# Patient Record
Sex: Female | Born: 1955 | Race: Black or African American | Hispanic: No | Smoking: Never smoker
Health system: Southern US, Community
[De-identification: ages and names within clinical notes are randomized; demographics above are authoritative.]

## PROBLEM LIST (undated history)

## (undated) DIAGNOSIS — F329 Major depressive disorder, single episode, unspecified: Secondary | ICD-10-CM

## (undated) DIAGNOSIS — K219 Gastro-esophageal reflux disease without esophagitis: Secondary | ICD-10-CM

## (undated) DIAGNOSIS — R002 Palpitations: Secondary | ICD-10-CM

## (undated) DIAGNOSIS — J45909 Unspecified asthma, uncomplicated: Secondary | ICD-10-CM

## (undated) DIAGNOSIS — D329 Benign neoplasm of meninges, unspecified: Secondary | ICD-10-CM

## (undated) DIAGNOSIS — R55 Syncope and collapse: Secondary | ICD-10-CM

## (undated) DIAGNOSIS — N6320 Unspecified lump in the left breast, unspecified quadrant: Secondary | ICD-10-CM

## (undated) DIAGNOSIS — R911 Solitary pulmonary nodule: Secondary | ICD-10-CM

## (undated) DIAGNOSIS — I10 Essential (primary) hypertension: Secondary | ICD-10-CM

## (undated) DIAGNOSIS — G4733 Obstructive sleep apnea (adult) (pediatric): Secondary | ICD-10-CM

## (undated) DIAGNOSIS — F32A Depression, unspecified: Secondary | ICD-10-CM

## (undated) DIAGNOSIS — G43909 Migraine, unspecified, not intractable, without status migrainosus: Secondary | ICD-10-CM

## (undated) DIAGNOSIS — S2249XA Multiple fractures of ribs, unspecified side, initial encounter for closed fracture: Secondary | ICD-10-CM

## (undated) DIAGNOSIS — E785 Hyperlipidemia, unspecified: Secondary | ICD-10-CM

## (undated) DIAGNOSIS — G459 Transient cerebral ischemic attack, unspecified: Secondary | ICD-10-CM

## (undated) DIAGNOSIS — S2239XA Fracture of one rib, unspecified side, initial encounter for closed fracture: Secondary | ICD-10-CM

## (undated) DIAGNOSIS — Z9289 Personal history of other medical treatment: Secondary | ICD-10-CM

## (undated) DIAGNOSIS — H409 Unspecified glaucoma: Secondary | ICD-10-CM

## (undated) HISTORY — DX: Solitary pulmonary nodule: R91.1

## (undated) HISTORY — DX: Transient cerebral ischemic attack, unspecified: G45.9

## (undated) HISTORY — DX: Fracture of one rib, unspecified side, initial encounter for closed fracture: S22.39XA

## (undated) HISTORY — DX: Migraine, unspecified, not intractable, without status migrainosus: G43.909

## (undated) HISTORY — DX: Benign neoplasm of meninges, unspecified: D32.9

## (undated) HISTORY — DX: Depression, unspecified: F32.A

## (undated) HISTORY — DX: Multiple fractures of ribs, unspecified side, initial encounter for closed fracture: S22.49XA

## (undated) HISTORY — DX: Major depressive disorder, single episode, unspecified: F32.9

## (undated) HISTORY — DX: Syncope and collapse: R55

## (undated) HISTORY — DX: Palpitations: R00.2

## (undated) HISTORY — DX: Hyperlipidemia, unspecified: E78.5

## (undated) HISTORY — DX: Unspecified lump in the left breast, unspecified quadrant: N63.20

## (undated) HISTORY — DX: Personal history of other medical treatment: Z92.89

## (undated) HISTORY — DX: Obstructive sleep apnea (adult) (pediatric): G47.33

---

## 1988-11-25 HISTORY — PX: BREAST EXCISIONAL BIOPSY: SUR124

## 2005-07-10 ENCOUNTER — Ambulatory Visit: Payer: Self-pay | Admitting: Family Medicine

## 2005-11-25 HISTORY — PX: COLONOSCOPY: SHX174

## 2006-08-04 ENCOUNTER — Other Ambulatory Visit: Payer: Self-pay

## 2006-08-04 ENCOUNTER — Inpatient Hospital Stay: Payer: Self-pay | Admitting: Internal Medicine

## 2006-09-02 ENCOUNTER — Ambulatory Visit: Payer: Self-pay

## 2006-11-05 ENCOUNTER — Ambulatory Visit: Payer: Self-pay | Admitting: Gastroenterology

## 2006-12-09 ENCOUNTER — Emergency Department: Payer: Self-pay | Admitting: General Practice

## 2006-12-18 ENCOUNTER — Emergency Department: Payer: Self-pay | Admitting: Emergency Medicine

## 2007-02-13 ENCOUNTER — Other Ambulatory Visit: Payer: Self-pay

## 2007-02-13 ENCOUNTER — Inpatient Hospital Stay: Payer: Self-pay | Admitting: Internal Medicine

## 2008-02-29 ENCOUNTER — Ambulatory Visit: Payer: Self-pay

## 2009-03-23 ENCOUNTER — Ambulatory Visit: Payer: Self-pay | Admitting: Family Medicine

## 2009-10-26 ENCOUNTER — Emergency Department: Payer: Self-pay | Admitting: Unknown Physician Specialty

## 2010-06-21 ENCOUNTER — Ambulatory Visit: Payer: Self-pay | Admitting: Family Medicine

## 2012-08-03 ENCOUNTER — Ambulatory Visit: Payer: Self-pay | Admitting: Otolaryngology

## 2012-08-18 ENCOUNTER — Encounter: Payer: Self-pay | Admitting: Otolaryngology

## 2012-08-25 ENCOUNTER — Encounter: Payer: Self-pay | Admitting: Otolaryngology

## 2013-01-21 ENCOUNTER — Ambulatory Visit: Payer: Self-pay | Admitting: Family Medicine

## 2014-01-27 ENCOUNTER — Ambulatory Visit: Payer: Self-pay | Admitting: Nurse Practitioner

## 2014-04-17 ENCOUNTER — Emergency Department: Payer: Self-pay | Admitting: Emergency Medicine

## 2014-04-17 LAB — URINALYSIS, COMPLETE
Bacteria: NONE SEEN
Bilirubin,UR: NEGATIVE
Glucose,UR: NEGATIVE mg/dL (ref 0–75)
KETONE: NEGATIVE
Leukocyte Esterase: NEGATIVE
NITRITE: NEGATIVE
PH: 6 (ref 4.5–8.0)
Protein: NEGATIVE
RBC,UR: <1 /HPF (ref 0–5)
SQUAMOUS EPITHELIAL: NONE SEEN
Specific Gravity: 1.006 (ref 1.003–1.030)
WBC UR: 1 /HPF (ref 0–5)

## 2014-04-17 LAB — TROPONIN I: Troponin-I: <0.02 ng/mL

## 2014-04-17 LAB — COMPREHENSIVE METABOLIC PANEL
ALK PHOS: 74 U/L
ALT: 20 U/L (ref 12–78)
ANION GAP: 5 — AB (ref 7–16)
AST: 21 U/L (ref 15–37)
Albumin: 3.8 g/dL (ref 3.4–5.0)
BUN: 10 mg/dL (ref 7–18)
Bilirubin,Total: 0.3 mg/dL (ref 0.2–1.0)
CHLORIDE: 105 mmol/L (ref 98–107)
Calcium, Total: 9.4 mg/dL (ref 8.5–10.1)
Co2: 30 mmol/L (ref 21–32)
Creatinine: 0.68 mg/dL (ref 0.60–1.30)
EGFR (African American): >60
GLUCOSE: 104 mg/dL — AB (ref 65–99)
Osmolality: 279 (ref 275–301)
POTASSIUM: 3.8 mmol/L (ref 3.5–5.1)
SODIUM: 140 mmol/L (ref 136–145)
TOTAL PROTEIN: 7.8 g/dL (ref 6.4–8.2)

## 2014-04-17 LAB — CBC
HCT: 39.3 % (ref 35.0–47.0)
HGB: 12.8 g/dL (ref 12.0–16.0)
MCH: 27.4 pg (ref 26.0–34.0)
MCHC: 32.6 g/dL (ref 32.0–36.0)
MCV: 84 fL (ref 80–100)
Platelet: 328 10*3/uL (ref 150–440)
RBC: 4.67 10*6/uL (ref 3.80–5.20)
RDW: 13.7 % (ref 11.5–14.5)
WBC: 8.5 10*3/uL (ref 3.6–11.0)

## 2014-04-17 LAB — MAGNESIUM: Magnesium: 2 mg/dL

## 2014-04-17 LAB — PRO B NATRIURETIC PEPTIDE: B-TYPE NATIURETIC PEPTID: 39 pg/mL (ref 0–125)

## 2014-12-06 ENCOUNTER — Ambulatory Visit: Payer: Self-pay | Admitting: Obstetrics and Gynecology

## 2014-12-06 LAB — CBC WITH DIFFERENTIAL/PLATELET
BASOS PCT: 0.3 %
Basophil #: 0 10*3/uL (ref 0.0–0.1)
Eosinophil #: 0.1 10*3/uL (ref 0.0–0.7)
Eosinophil %: 1.1 %
HCT: 38.5 % (ref 35.0–47.0)
HGB: 12.1 g/dL (ref 12.0–16.0)
Lymphocyte #: 2.9 10*3/uL (ref 1.0–3.6)
Lymphocyte %: 35.7 %
MCH: 26.7 pg (ref 26.0–34.0)
MCHC: 31.4 g/dL — AB (ref 32.0–36.0)
MCV: 85 fL (ref 80–100)
MONO ABS: 0.5 x10 3/mm (ref 0.2–0.9)
Monocyte %: 6.7 %
NEUTROS PCT: 56.2 %
Neutrophil #: 4.5 10*3/uL (ref 1.4–6.5)
Platelet: 323 10*3/uL (ref 150–440)
RBC: 4.53 10*6/uL (ref 3.80–5.20)
RDW: 14.3 % (ref 11.5–14.5)
WBC: 8 10*3/uL (ref 3.6–11.0)

## 2014-12-06 LAB — PROTIME-INR
INR: 0.9
PROTHROMBIN TIME: 11.6 s (ref 11.5–14.7)

## 2014-12-19 ENCOUNTER — Ambulatory Visit: Payer: Self-pay | Admitting: Obstetrics and Gynecology

## 2014-12-19 HISTORY — PX: OTHER SURGICAL HISTORY: SHX169

## 2014-12-20 LAB — HEMOGLOBIN: HGB: 11.6 g/dL — AB (ref 12.0–16.0)

## 2015-03-20 LAB — SURGICAL PATHOLOGY

## 2015-03-26 NOTE — Consult Note (Signed)
PATIENT NAME:  Selena Walker, Selena Walker MR#:  [id number] DATE OF BIRTH:  Mar 18, 1956  DATE OF CONSULTATION:  12/19/2014  REFERRING PHYSICIAN:   CONSULTING PHYSICIAN:  Loreli Dollar, MD  HISTORY OF PRESENT ILLNESS: This 59 year old female was in the operating room having a robotically assisted total laparoscopic hysterectomy with bilateral salpingo-oophorectomy by Dr. Leafy Ro. Dr. Leafy Ro had completed the hysterectomy and noticed a small tear of what appeared to be the serosa or epiploic appendage in the region of the rectosigmoid junction and called for general surgery consultation.   On arrival I discussed this with Dr. Leafy Ro and viewed the laparoscopic video images, could see what appeared to be a small tear in the serosa. The sigmoid colon was identified coursing in the left pelvis towards the rectum. It appeared that this location of the tear was in the region of the rectosigmoid junction. There was no apparent fecal soilage. It appeared that hemostasis was intact. It was noted that electrocautery was used during the course of the procedure. There appeared to be no cautery artifact at this site.   Impression appeared to be a small serosal. Dr. Leafy Ro subsequently placed a 4-0 Vicryl figure-of-eight suture to repair this.   I later spoke to Miss Kostka in her room after surgery and she appears to be making satisfactory progress, has received 1 dose of Toradol and Dr. Leafy Ro found abdomen is with no significant tenderness.   The patient does have some past tendency towards constipation.   IMPRESSION:  1.  Serosal tear of the proximal rectum-distal sigmoid colon junction.  2.  History of constipation.   Suggested taking a stool softener.   Will sign off this case.    ____________________________ J. Rochel Brome, MD jws:bu D: 12/19/2014 17:56:51 ET T: 12/19/2014 19:35:11 ET JOB#: [id number]  cc: Loreli Dollar, MD, <Dictator> Loreli Dollar MD ELECTRONICALLY SIGNED 12/20/2014 18:35

## 2015-03-26 NOTE — Op Note (Signed)
PATIENT NAME:  Selena Walker, Selena MR#:  Walker DATE OF BIRTH:  1956-05-20  DATE OF PROCEDURE:  12/19/2014  PREOPERATIVE DIAGNOSES: Postmenopausal bleeding, endometrial fibroid, and endometrial polyp.  POSTOPERATIVE DIAGNOSES: Postmenopausal bleeding, endometrial fibroid, and endometrial polyp. Pelvic adhesions and aberrant vasculature.   PROCEDURE PERFORMED:  1.  Robotically assisted, total laparoscopic hysterectomy with bilateral salpingo-oophorectomy.  2.  Cystoscopy.  3.  Repair of colonic epiploica versus serosal defect.  4.  Lysis of adhesions. Of note, lysis of adhesions was a significant portion of the case.   SURGEON: Angelina Pih, MD   ASSISTANT: Will Bonnet, MD  INTRAOPERATIVE CONSULT: General surgery, Dr. Tamala Julian with [facility] ([facility])  IV FLUIDS: 1800 mL.   ESTIMATED BLOOD LOSS: 50 mL   URINE OUTPUT: 1300 mL, including cystoscopy fluid.   FINDINGS: Significant pelvic adhesions between the bladder and uterus, the uterus and pelvic sidewalls, and the bilateral adnexa such that the ovaries were tucked deep in the pelvis and the distal portions of the fallopian tubes were retroperitoneal.   COMPLICATIONS: A small 10 mm defect in the colonic peritoneum, into the epiploica that did not appear to ride into the serosa with no extrusion of any bowel contents or bleeding, at the level of the pelvic brim posteriorly.   SPECIMENS: Uterus and cervix, and right and left tube and ovaries.   CONDITION: Stable to PACU.  INDICATION FOR PROCEDURE: Ms. Selena Walker is a  59 year old postmenopausal female, G5, P4, with a history of postmenopausal bleeding. Preoperative diagnostic imaging and sampling revealed an endometrial polyp and multiple uterine fibroids, with a benign endometrial biopsy and an endometrium that measured 4 mm by ultrasound. She desires definitive management for intermittently significantly heavy bleeding, which remains persistently worrisome to the patient.    PROCEDURE: The patient was taken to the Operating Room, where she was identified by name and birth date. She was placed on the operating table in a supine position and general anesthesia was induced without difficulty. She was then positioned in a dorsal lithotomy position and was prepped and draped in the usual sterile fashion. Two grams of Ancef were given prior to the start of the procedure. A formal timeout procedure was performed with all team members present and in agreement. A vaginal speculum was placed, revealing a small cervix. The anterior lip of the cervix was grasped with a single-tooth tenaculum and a medium-size V-Care uterine manipulator was placed without incident. A Foley catheter was then placed for the remainder of the procedure. The patient was repositioned for robotic docking, and attention was turned to the abdomen.   A stab incision was made at Palmer's point in the left upper quadrant after verification of an orogastric tube was placed, and a Veress needle was used to insufflate the abdomen to 15 mmHg. The robotic ports were then placed in the a standard lazy-W position. The only port that was not placed under direct visualization was the first left lateral port; this was done to assure that no adhesions were at the site of the umbilical port as she had had previous surgery through her umbilicus. A total of 5 ports were placed, including a right bedside-assist port. The patient was placed in deep Trendelenberg, and the robot was side-docked in the usual position. A grasper was placed on arm #3. A bipolar Leda Gauze was placed in arm #2 and a monopolar scissors was placed in arm #1. Examination of the port entry sites revealed no lesions. There is no obvious pathology in the  abdomen.   Examination of the pelvis revealed filmy pelvic adhesions throughout. Her left fallopian tube appeared to be retroperitoneal, lateral to the bladder and curving above the round ligament. Her left ovary was  tucked in the posterior cul-de-sac, and her right tube and ovary were likewise obscured by adhesions. After identification of the ureter path, these right-sided adhesions were carefully taken down without complication for at least 30 minutes of the procedure. Attention was then turned to the left side. The IP ligament was identified, and followed to the ovary. The pathway of the left ureter was carefully followed where it crossed the pelvic brim and coursed along the left pelvic sidewall.The ovary was released through lysis of adhesions atraumatically. The IP ligament was then cauterized and the ovary was divided from the pelvic sidewall. Prior to any cautery taking place, the left ureter was followed over the pelvic brim and deep into the pelvis. The round ligament was then grasped and dissected carefully. The anterior and posterior broad ligaments were also dissected carefully down. Abberent vasculature was noted to be heavily congested on left uterine wall.   Attention was turned to the right side. In a similar fashion, further anterior lysis of adhesions restored normal anatomy. The round ligament was divided, and the anterior peritoneum was carefully excised. The left IP ligament was then cauterized and divided, being careful to avoid the right ureter which was kept in view at all times. The right fallopian tube and ovary were then carefully cauterized and dissected away from the pelvic sidewall, and the broad ligament was gently dissected down to identify the uterine arteries. Heavy vasculature was noted on the right side of the uterus as well. A bladder flap was carefully created, and the external cervical os was identified. Attention was returned to the left side, and the uterine vessels were dissected carefully down. Attention was turned to the right side and, in a similar fashion, the uterine vessels were cauterized and dissected away, being careful not to carry the cautery laterally.   The posterior  cul-de-sac was examined and the uterosacrals were noted to have heavy venous flow as well.  The monopolar cautery was used to open the vaginal cuff anteriorly, and this was carried to the right side laterally. The posterior cuff was similarly identified and, using a mixture of bipolar and monopolar cautery, the cuff was opened in a full circumferential way, taking care of bleeding as it occurred. Once the uterus was completely free of its attachments, it was pulled through into the vagina, maintaining pneumoperitoneum. The vaginal cuff was then closed with V-Loc suture in a running fashion for excellent hemostasis and reapproximation.   Because of the vascularity and electrocautery needed to assure hemostasis on the left side, a cystoscopy was undertaken at this time which revealed strong flow from the bilateral ureters. 1 mL or 100 mg of fluorescein was given IV to aid in the visualization of the urine flow. The Foley catheter was then placed into the bladder and the uterus was removed entirely from the vagina. Multiple small fibroids were noted on examination of the uterus.  Attention was then returned to the abdomen. A 10 mm defect was noted in the epiploica of the colon with a small pulsing vessel that had not been severed, noted in this pericolonic fat. General surgery intra-op consult was called, and a figure of eight suture was placed in the peritoneum above the epiploica to cover the vessel at their request. A figure-of-eight was used, 4-0 Vicryl suture  on an [place] needle, and was undertaken without complication. Full survey of the pelvis revealed no active bleeding, good reapproximation of the cuff, and the vascular pedicles were likewise dry.   General anesthesia was reversed without difficulty, and the patient is awaiting recovery in the PACU.    ____________________________ Angelina Pih, MD beb:MT D: 12/19/2014 13:10:30 ET T: 12/19/2014 15:04:43 ET JOB#: [id number]  cc: Angelina Pih,  MD, <Dictator> Angelina Pih MD ELECTRONICALLY SIGNED 12/20/2014 17:59

## 2015-08-29 ENCOUNTER — Emergency Department: Payer: Medicare HMO

## 2015-08-29 ENCOUNTER — Encounter: Payer: Self-pay | Admitting: *Deleted

## 2015-08-29 ENCOUNTER — Emergency Department
Admission: EM | Admit: 2015-08-29 | Discharge: 2015-08-29 | Disposition: A | Discharge status: Home / Self Care | Payer: Medicare HMO | Attending: Emergency Medicine | Admitting: Emergency Medicine

## 2015-08-29 DIAGNOSIS — I10 Essential (primary) hypertension: Secondary | ICD-10-CM | POA: Insufficient documentation

## 2015-08-29 DIAGNOSIS — R109 Unspecified abdominal pain: Secondary | ICD-10-CM

## 2015-08-29 DIAGNOSIS — R110 Nausea: Secondary | ICD-10-CM | POA: Diagnosis not present

## 2015-08-29 DIAGNOSIS — R1032 Left lower quadrant pain: Secondary | ICD-10-CM | POA: Insufficient documentation

## 2015-08-29 HISTORY — DX: Essential (primary) hypertension: I10

## 2015-08-29 HISTORY — DX: Unspecified glaucoma: H40.9

## 2015-08-29 LAB — COMPREHENSIVE METABOLIC PANEL
ALK PHOS: 58 U/L (ref 38–126)
ALT: 16 U/L (ref 14–54)
AST: 26 U/L (ref 15–41)
Albumin: 4.3 g/dL (ref 3.5–5.0)
Anion gap: 10 (ref 5–15)
BILIRUBIN TOTAL: 0.5 mg/dL (ref 0.3–1.2)
BUN: 15 mg/dL (ref 6–20)
CALCIUM: 9.7 mg/dL (ref 8.9–10.3)
CO2: 29 mmol/L (ref 22–32)
CREATININE: 0.8 mg/dL (ref 0.44–1.00)
Chloride: 100 mmol/L — ABNORMAL LOW (ref 101–111)
Glucose, Bld: 158 mg/dL — ABNORMAL HIGH (ref 65–99)
Potassium: 3 mmol/L — ABNORMAL LOW (ref 3.5–5.1)
Sodium: 139 mmol/L (ref 135–145)
Total Protein: 7.7 g/dL (ref 6.5–8.1)

## 2015-08-29 LAB — URINALYSIS COMPLETE WITH MICROSCOPIC (ARMC ONLY)
BACTERIA UA: NONE SEEN
Bilirubin Urine: NEGATIVE
Glucose, UA: NEGATIVE mg/dL
Ketones, ur: NEGATIVE mg/dL
Leukocytes, UA: NEGATIVE
Nitrite: NEGATIVE
PH: 5 (ref 5.0–8.0)
PROTEIN: NEGATIVE mg/dL
SQUAMOUS EPITHELIAL / LPF: NONE SEEN
Specific Gravity, Urine: 1.01 (ref 1.005–1.030)

## 2015-08-29 LAB — CBC
HCT: 37.6 % (ref 35.0–47.0)
Hemoglobin: 12.5 g/dL (ref 12.0–16.0)
MCH: 27.4 pg (ref 26.0–34.0)
MCHC: 33.3 g/dL (ref 32.0–36.0)
MCV: 82.4 fL (ref 80.0–100.0)
PLATELETS: 347 10*3/uL (ref 150–440)
RBC: 4.56 MIL/uL (ref 3.80–5.20)
RDW: 14.2 % (ref 11.5–14.5)
WBC: 10.8 10*3/uL (ref 3.6–11.0)

## 2015-08-29 LAB — LIPASE, BLOOD: LIPASE: 24 U/L (ref 22–51)

## 2015-08-29 MED ORDER — OXYCODONE-ACETAMINOPHEN 5-325 MG PO TABS
2.0000 | ORAL_TABLET | Freq: Once | ORAL | Status: AC
  Administered 2015-08-29: 2 via ORAL
  Filled 2015-08-29: qty 2

## 2015-08-29 MED ORDER — TRAMADOL HCL 50 MG PO TABS: 50.0000 mg | ORAL_TABLET | Freq: Four times a day (QID) | ORAL | Status: AC | PRN

## 2015-08-29 NOTE — Discharge Instructions (Signed)
Flank Pain °Flank pain refers to pain that is located on the side of the body between the upper abdomen and the back. The pain may occur over a short period of time (acute) or may be long-term or reoccurring (chronic). It may be mild or severe. Flank pain can be caused by many things. °CAUSES  °Some of the more common causes of flank pain include: °· Muscle strains.   °· Muscle spasms.   °· A disease of your spine (vertebral disk disease).   °· A lung infection (pneumonia).   °· Fluid around your lungs (pulmonary edema).   °· A kidney infection.   °· Kidney stones.   °· A very painful skin rash caused by the chickenpox virus (shingles).   °· Gallbladder disease.   °HOME CARE INSTRUCTIONS  °Home care will depend on the cause of your pain. In general, °· Rest as directed by your caregiver. °· Drink enough fluids to keep your urine clear or pale yellow. °· Only take over-the-counter or prescription medicines as directed by your caregiver. Some medicines may help relieve the pain. °· Tell your caregiver about any changes in your pain. °· Follow up with your caregiver as directed. °SEEK IMMEDIATE MEDICAL CARE IF:  °· Your pain is not controlled with medicine.   °· You have new or worsening symptoms. °· Your pain increases.   °· You have abdominal pain.   °· You have shortness of breath.   °· You have persistent nausea or vomiting.   °· You have swelling in your abdomen.   °· You feel faint or pass out.   °· You have blood in your urine. °· You have a fever or persistent symptoms for more than 2-3 days. °· You have a fever and your symptoms suddenly get worse. °MAKE SURE YOU:  °· Understand these instructions. °· Will watch your condition. °· Will get help right away if you are not doing well or get worse. °  °This information is not intended to replace advice given to you by your health care provider. Make sure you discuss any questions you have with your health care provider. °  °Document Released: 01/02/2006 Document  Revised: 08/05/2012 Document Reviewed: 06/25/2012 °Elsevier Interactive Patient Education ©2016 [employer] ° °

## 2015-08-29 NOTE — ED Provider Notes (Signed)
[facility] Emergency Department Provider Note     Time seen: ----------------------------------------- 9:51 PM on 08/29/2015 -----------------------------------------    I have reviewed the triage vital signs and the nursing notes.   HISTORY  Chief Complaint Abdominal Pain    HPI Selena Walker is a 59 y.o. female presents ER with left lower abdominal pain radiating to left flank and back. She states started yesterday, describes it as stabbing with nausea.She's not had a history of this before, nothing makes it better or worse.   Past Medical History  Diagnosis Date  . Hypertension   . Glaucoma     There are no active problems to display for this patient.   Past Surgical History  Procedure Laterality Date  . Abdominal hysterectomy      Allergies Review of patient's allergies indicates no known allergies.  Social History Social History  Substance Use Topics  . Smoking status: Never Smoker   . Smokeless tobacco: None  . Alcohol Use: No    Review of Systems Constitutional: Negative for fever. Eyes: Negative for visual changes. ENT: Negative for sore throat. Cardiovascular: Negative for chest pain. Respiratory: Negative for shortness of breath. Gastrointestinal: Positive for abdominal pain and nausea Genitourinary: Negative for dysuria. Musculoskeletal: Negative for back pain. Skin: Negative for rash. Neurological: Negative for headaches, focal weakness or numbness.  10-point ROS otherwise negative.  ____________________________________________   PHYSICAL EXAM:  VITAL SIGNS: ED Triage Vitals  Enc Vitals Group     BP 08/29/15 1942 128/57 mmHg     Pulse Rate 08/29/15 1942 100     Resp 08/29/15 1942 16     Temp 08/29/15 1942 97.8 F (36.6 C)     Temp Source 08/29/15 1942 Oral     SpO2 08/29/15 1942 97 %     Weight 08/29/15 1942 210 lb (95.255 kg)     Height 08/29/15 1942 5\' 6"  (1.676 m)     Head Cir --      Peak Flow  --      Pain Score 08/29/15 1948 7     Pain Loc --      Pain Edu? --      Excl. in [place]? --     Constitutional: Alert and oriented. Well appearing and in no distress. Eyes: Conjunctivae are normal. PERRL. Normal extraocular movements. ENT   Head: Normocephalic and atraumatic.   Nose: No congestion/rhinnorhea.   Mouth/Throat: Mucous membranes are moist.   Neck: No stridor. Cardiovascular: Normal rate, regular rhythm. Normal and symmetric distal pulses are present in all extremities. No murmurs, rubs, or gallops. Respiratory: Normal respiratory effort without tachypnea nor retractions. Breath sounds are clear and equal bilaterally. No wheezes/rales/rhonchi. Gastrointestinal: Soft and nontender. No distention. No abdominal bruits.  Musculoskeletal: Nontender with normal range of motion in all extremities. No joint effusions.  No lower extremity tenderness nor edema. Neurologic:  Normal speech and language. No gross focal neurologic deficits are appreciated. Speech is normal. No gait instability. Skin:  Skin is warm, dry and intact. No rash noted. Psychiatric: Mood and affect are normal. Speech and behavior are normal. Patient exhibits appropriate insight and judgment. ____________________________________________  ED COURSE:  Pertinent labs & imaging results that were available during my care of the patient were reviewed by me and considered in my medical decision making (see chart for details). Possible renal colic. She may need imaging of the left flank. ____________________________________________    LABS (pertinent positives/negatives)  Labs Reviewed  COMPREHENSIVE METABOLIC PANEL - Abnormal;  Notable for the following:    Potassium 3.0 (*)    Chloride 100 (*)    Glucose, Bld 158 (*)    All other components within normal limits  URINALYSIS COMPLETEWITH MICROSCOPIC ([facility] ONLY) - Abnormal; Notable for the following:    Color, Urine STRAW (*)    APPearance CLEAR (*)     Hgb urine dipstick 2+ (*)    All other components within normal limits  LIPASE, BLOOD  CBC    RADIOLOGY Images were viewed by me  CT renal protocol is unremarkable  ____________________________________________  FINAL ASSESSMENT AND PLAN  Flank pain  Plan: Patient with labs and imaging as dictated above. No clear etiology for flank pain. She'll be given tramadol, encouraged to have close follow-up with her doctor for reevaluation   Earleen Newport, MD   Earleen Newport, MD 08/29/15 [id number]

## 2015-08-29 NOTE — ED Notes (Signed)
Patient transported to CT 

## 2015-08-29 NOTE — ED Notes (Signed)
Pt c/o left lower abdominal pain radiating to left flank/back pain, started yesterday. Reports as stabbing pain intermittently. Worse when she lies down. Nausea. No fever.

## 2016-02-16 ENCOUNTER — Encounter: Payer: Self-pay | Admitting: *Deleted

## 2016-02-19 ENCOUNTER — Encounter
Admission: RE | Disposition: A | Discharge status: Home / Self Care | Payer: Self-pay | Source: Ambulatory Visit | Attending: Unknown Physician Specialty

## 2016-02-19 ENCOUNTER — Encounter: Payer: Self-pay | Admitting: *Deleted

## 2016-02-19 ENCOUNTER — Ambulatory Visit: Payer: Medicare HMO | Admitting: Anesthesiology

## 2016-02-19 ENCOUNTER — Ambulatory Visit
Admission: RE | Admit: 2016-02-19 | Discharge: 2016-02-19 | Disposition: A | Discharge status: Home / Self Care | Payer: Medicare HMO | Source: Ambulatory Visit | Attending: Unknown Physician Specialty | Admitting: Unknown Physician Specialty

## 2016-02-19 DIAGNOSIS — Z1211 Encounter for screening for malignant neoplasm of colon: Secondary | ICD-10-CM | POA: Diagnosis present

## 2016-02-19 DIAGNOSIS — Z9889 Other specified postprocedural states: Secondary | ICD-10-CM | POA: Diagnosis not present

## 2016-02-19 DIAGNOSIS — K640 First degree hemorrhoids: Secondary | ICD-10-CM | POA: Diagnosis not present

## 2016-02-19 DIAGNOSIS — K621 Rectal polyp: Secondary | ICD-10-CM | POA: Diagnosis not present

## 2016-02-19 DIAGNOSIS — I10 Essential (primary) hypertension: Secondary | ICD-10-CM | POA: Insufficient documentation

## 2016-02-19 DIAGNOSIS — H409 Unspecified glaucoma: Secondary | ICD-10-CM | POA: Insufficient documentation

## 2016-02-19 DIAGNOSIS — D125 Benign neoplasm of sigmoid colon: Secondary | ICD-10-CM | POA: Diagnosis not present

## 2016-02-19 DIAGNOSIS — Z79899 Other long term (current) drug therapy: Secondary | ICD-10-CM | POA: Insufficient documentation

## 2016-02-19 DIAGNOSIS — Z90710 Acquired absence of both cervix and uterus: Secondary | ICD-10-CM | POA: Insufficient documentation

## 2016-02-19 DIAGNOSIS — Z8371 Family history of colonic polyps: Secondary | ICD-10-CM | POA: Diagnosis not present

## 2016-02-19 HISTORY — PX: COLONOSCOPY WITH PROPOFOL: SHX5780

## 2016-02-19 SURGERY — COLONOSCOPY WITH PROPOFOL
Anesthesia: General

## 2016-02-19 MED ORDER — SODIUM CHLORIDE 0.9 % IV SOLN: INTRAVENOUS | Status: DC

## 2016-02-19 MED ORDER — PROPOFOL 10 MG/ML IV BOLUS
INTRAVENOUS | Status: DC | PRN
  Administered 2016-02-19: 80 mg via INTRAVENOUS

## 2016-02-19 MED ORDER — LACTATED RINGERS IV SOLN
INTRAVENOUS | Status: DC | PRN
Start: 2016-02-19 — End: 2016-02-19
  Administered 2016-02-19: 13:00:00 via INTRAVENOUS

## 2016-02-19 MED ORDER — SODIUM CHLORIDE 0.9 % IV SOLN
INTRAVENOUS | Status: DC
  Administered 2016-02-19: 13:00:00 via INTRAVENOUS

## 2016-02-19 MED ORDER — PROPOFOL 500 MG/50ML IV EMUL
INTRAVENOUS | Status: DC | PRN
  Administered 2016-02-19: 130 ug/kg/min via INTRAVENOUS

## 2016-02-19 NOTE — Transfer of Care (Signed)
Immediate Anesthesia Transfer of Care Note  Patient: Selena Walker  Procedure(s) Performed: Procedure(s): COLONOSCOPY WITH PROPOFOL (N/A)  Patient Location: PACU and Endoscopy Unit  Anesthesia Type:General  Level of Consciousness: awake, alert  and oriented  Airway & Oxygen Therapy: Patient Spontanous Breathing and Patient connected to nasal cannula oxygen  Post-op Assessment: Report given to RN and Post -op Vital signs reviewed and stable  Post vital signs: Reviewed and stable  Last Vitals:  Filed Vitals:   02/19/16 1144  BP: 108/93  Pulse: 80  Temp: 36.6 C  Resp: 16    Complications: No apparent anesthesia complications

## 2016-02-19 NOTE — H&P (Signed)
   Primary Care Physician:  Petra Kuba, MD Primary Gastroenterologist:  Dr. Vira Agar  Pre-Procedure History & Physical: HPI:  Selena Walker is a 60 y.o. female is here for an colonoscopy.   Past Medical History  Diagnosis Date  . Hypertension   . Glaucoma     Past Surgical History  Procedure Laterality Date  . Abdominal hysterectomy    . Colonoscopy  2007    Prior to Admission medications   Medication Sig Start Date End Date Taking? Authorizing Provider  chlorthalidone (HYGROTON) 25 MG tablet Take 25 mg by mouth daily.   Yes Historical Provider, MD  traMADol (ULTRAM) 50 MG tablet Take 1 tablet (50 mg total) by mouth every 6 (six) hours as needed. 08/29/15 08/28/16  Earleen Newport, MD    Allergies as of 02/07/2016  . (No Known Allergies)    History reviewed. No pertinent family history.  Social History   Social History  . Marital Status: Married    Spouse Name: N/A  . Number of Children: N/A  . Years of Education: N/A   Occupational History  . Not on file.   Social History Main Topics  . Smoking status: Never Smoker   . Smokeless tobacco: Never Used  . Alcohol Use: No  . Drug Use: No  . Sexual Activity: Not on file   Other Topics Concern  . Not on file   Social History Narrative    Review of Systems: See HPI, otherwise negative ROS  Physical Exam: BP 108/93 mmHg  Pulse 80  Temp(Src) 97.8 F (36.6 C) (Tympanic)  Resp 16  Ht 5\' 6"  (1.676 m)  Wt 95.255 kg (210 lb)  BMI 33.91 kg/m2  SpO2 100% General:   Alert,  pleasant and cooperative in NAD Head:  Normocephalic and atraumatic. Neck:  Supple; no masses or thyromegaly. Lungs:  Clear throughout to auscultation.    Heart:  Regular rate and rhythm. Abdomen:  Soft, nontender and nondistended. Normal bowel sounds, without guarding, and without rebound.   Neurologic:  Alert and  oriented x4;  grossly normal neurologically.  Impression/Plan: Selena Walker is here for an colonoscopy to be  performed for screening colonoscopy due to family hx of colon polyps  Risks, benefits, limitations, and alternatives regarding  colonoscopy have been reviewed with the patient.  Questions have been answered.  All parties agreeable.   Gaylyn Cheers, MD  02/19/2016, 12:48 PM

## 2016-02-19 NOTE — Op Note (Signed)
[facility] Gastroenterology Patient Name: Selena Walker Procedure Date: 02/19/2016 12:52 PM MRN: [id number] Account #: [id number] Date of Birth: Jan 21, 1956 Admit Type: Outpatient Age: 59 Room: [facility] ENDO ROOM 1 Gender: Female Note Status: Finalized Procedure:            Colonoscopy Indications:          Colon cancer screening in patient at increased risk:                        Family history of 1st-degree relative with colon polyps Providers:            Manya Silvas, MD Referring MD:         Sena Hitch, MD (Referring MD) Medicines:            Propofol per Anesthesia Complications:        No immediate complications. Procedure:            Pre-Anesthesia Assessment:                       - After reviewing the risks and benefits, the patient                        was deemed in satisfactory condition to undergo the                        procedure.                       After obtaining informed consent, the colonoscope was                        passed under direct vision. Throughout the procedure,                        the patient's blood pressure, pulse, and oxygen                        saturations were monitored continuously. The                        Colonoscope was introduced through the anus and                        advanced to the the cecum, identified by appendiceal                        orifice and ileocecal valve. The colonoscopy was                        performed without difficulty. The patient tolerated the                        procedure well. The quality of the bowel preparation                        was good. Findings:      A diminutive polyp was found in the ileocecal valve. The polyp was       sessile. The polyp was removed with a jumbo cold forceps. Resection and       retrieval were complete.  A small polyp was found in the sigmoid colon. The polyp was sessile. The       polyp was removed with a hot snare. Resection and  retrieval were       complete.      Two sessile polyps were found in the rectum. The polyps were diminutive       in size. These polyps were removed with a cold biopsy forceps. Resection       and retrieval were complete.      Internal hemorrhoids were found during endoscopy. The hemorrhoids were       small and Grade I (internal hemorrhoids that do not prolapse).      The exam was otherwise without abnormality. Impression:           - One diminutive polyp at the ileocecal valve, removed                        with a jumbo cold forceps. Resected and retrieved.                       - One small polyp in the sigmoid colon, removed with a                        hot snare. Resected and retrieved.                       - Two diminutive polyps in the rectum, removed with a                        cold biopsy forceps. Resected and retrieved.                       - Internal hemorrhoids.                       - The examination was otherwise normal. Recommendation:       - Await pathology results. Manya Silvas, MD 02/19/2016 1:18:06 PM This report has been signed electronically. Number of Addenda: 0 Note Initiated On: 02/19/2016 12:52 PM Scope Withdrawal Time: 0 hours 16 minutes 41 seconds  Total Procedure Duration: 0 hours 19 minutes 28 seconds       [facility]

## 2016-02-19 NOTE — Anesthesia Preprocedure Evaluation (Signed)
Anesthesia Evaluation  Patient identified by MRN, date of birth, ID band Patient awake    Reviewed: Allergy & Precautions, NPO status , Patient's Chart, lab work & pertinent test results  History of Anesthesia Complications Negative for: history of anesthetic complications  Airway Mallampati: II       Dental  (+) Missing, Poor Dentition   Pulmonary neg pulmonary ROS,           Cardiovascular hypertension, Pt. on medications      Neuro/Psych TIA (x 3, L sided weakness lasted about 1 hour each time)   GI/Hepatic negative GI ROS, Neg liver ROS, GERD  Medicated and Poorly Controlled,  Endo/Other  negative endocrine ROS  Renal/GU negative Renal ROS     Musculoskeletal   Abdominal   Peds  Hematology negative hematology ROS (+)   Anesthesia Other Findings   Reproductive/Obstetrics                             Anesthesia Physical Anesthesia Plan  ASA: II  Anesthesia Plan: General   Post-op Pain Management:    Induction: Intravenous  Airway Management Planned: Nasal Cannula  Additional Equipment:   Intra-op Plan:   Post-operative Plan:   Informed Consent: I have reviewed the patients History and Physical, chart, labs and discussed the procedure including the risks, benefits and alternatives for the proposed anesthesia with the patient or authorized representative who has indicated his/her understanding and acceptance.     Plan Discussed with:   Anesthesia Plan Comments:         Anesthesia Quick Evaluation

## 2016-02-19 NOTE — Anesthesia Postprocedure Evaluation (Signed)
Anesthesia Post Note  Patient: Selena Walker  Procedure(s) Performed: Procedure(s) (LRB): COLONOSCOPY WITH PROPOFOL (N/A)  Patient location during evaluation: Endoscopy Anesthesia Type: General Level of consciousness: awake and alert Pain management: pain level controlled Vital Signs Assessment: post-procedure vital signs reviewed and stable Respiratory status: spontaneous breathing and respiratory function stable Cardiovascular status: stable Anesthetic complications: no    Last Vitals:  Filed Vitals:   02/19/16 1144 02/19/16 1319  BP: 108/93 117/82  Pulse: 80 81  Temp: 36.6 C 36.2 C  Resp: 16 10    Last Pain: There were no vitals filed for this visit.               Langdon Crosson K

## 2016-02-20 ENCOUNTER — Encounter: Payer: Self-pay | Admitting: Unknown Physician Specialty

## 2016-02-20 LAB — SURGICAL PATHOLOGY

## 2016-04-03 ENCOUNTER — Encounter: Payer: Self-pay | Admitting: *Deleted

## 2016-04-03 ENCOUNTER — Emergency Department
Admission: EM | Admit: 2016-04-03 | Discharge: 2016-04-03 | Disposition: A | Discharge status: Home / Self Care | Payer: Medicare HMO | Attending: Student | Admitting: Student

## 2016-04-03 ENCOUNTER — Emergency Department: Payer: Medicare HMO

## 2016-04-03 DIAGNOSIS — R05 Cough: Secondary | ICD-10-CM | POA: Diagnosis not present

## 2016-04-03 DIAGNOSIS — R197 Diarrhea, unspecified: Secondary | ICD-10-CM | POA: Diagnosis present

## 2016-04-03 DIAGNOSIS — R059 Cough, unspecified: Secondary | ICD-10-CM

## 2016-04-03 DIAGNOSIS — N390 Urinary tract infection, site not specified: Secondary | ICD-10-CM | POA: Diagnosis not present

## 2016-04-03 DIAGNOSIS — I10 Essential (primary) hypertension: Secondary | ICD-10-CM | POA: Diagnosis not present

## 2016-04-03 DIAGNOSIS — Z91013 Allergy to seafood: Secondary | ICD-10-CM | POA: Insufficient documentation

## 2016-04-03 LAB — URINALYSIS COMPLETE WITH MICROSCOPIC (ARMC ONLY)
BACTERIA UA: NONE SEEN
Bilirubin Urine: NEGATIVE
Glucose, UA: NEGATIVE mg/dL
Ketones, ur: NEGATIVE mg/dL
Nitrite: NEGATIVE
PH: 7 (ref 5.0–8.0)
PROTEIN: NEGATIVE mg/dL
SPECIFIC GRAVITY, URINE: 1.006 (ref 1.005–1.030)

## 2016-04-03 LAB — COMPREHENSIVE METABOLIC PANEL
ALBUMIN: 4 g/dL (ref 3.5–5.0)
ALK PHOS: 59 U/L (ref 38–126)
ALT: 14 U/L (ref 14–54)
ANION GAP: 8 (ref 5–15)
AST: 18 U/L (ref 15–41)
BUN: 9 mg/dL (ref 6–20)
CO2: 30 mmol/L (ref 22–32)
Calcium: 9.5 mg/dL (ref 8.9–10.3)
Chloride: 103 mmol/L (ref 101–111)
Creatinine, Ser: 0.73 mg/dL (ref 0.44–1.00)
GFR calc Af Amer: >60 mL/min (ref >60)
GFR calc non Af Amer: >60 mL/min (ref >60)
GLUCOSE: 133 mg/dL — AB (ref 65–99)
POTASSIUM: 3.5 mmol/L (ref 3.5–5.1)
SODIUM: 141 mmol/L (ref 135–145)
Total Bilirubin: 0.7 mg/dL (ref 0.3–1.2)
Total Protein: 7.8 g/dL (ref 6.5–8.1)

## 2016-04-03 LAB — CBC
HCT: 36.8 % (ref 35.0–47.0)
Hemoglobin: 12.2 g/dL (ref 12.0–16.0)
MCH: 26.8 pg (ref 26.0–34.0)
MCHC: 33.1 g/dL (ref 32.0–36.0)
MCV: 80.8 fL (ref 80.0–100.0)
Platelets: 353 10*3/uL (ref 150–440)
RBC: 4.55 MIL/uL (ref 3.80–5.20)
RDW: 13.7 % (ref 11.5–14.5)
WBC: 11.2 10*3/uL — AB (ref 3.6–11.0)

## 2016-04-03 LAB — TROPONIN I

## 2016-04-03 LAB — LIPASE, BLOOD: Lipase: 20 U/L (ref 11–51)

## 2016-04-03 MED ORDER — SODIUM CHLORIDE 0.9 % IV BOLUS (SEPSIS)
1000.0000 mL | Freq: Once | INTRAVENOUS | Status: AC
  Administered 2016-04-03: 1000 mL via INTRAVENOUS

## 2016-04-03 MED ORDER — NITROFURANTOIN MONOHYD MACRO 100 MG PO CAPS
100.0000 mg | ORAL_CAPSULE | Freq: Once | ORAL | Status: AC
Start: 2016-04-03 — End: 2016-04-03
  Administered 2016-04-03: 100 mg via ORAL
  Filled 2016-04-03: qty 1

## 2016-04-03 MED ORDER — ONDANSETRON HCL 4 MG/2ML IJ SOLN
4.0000 mg | Freq: Once | INTRAMUSCULAR | Status: AC
  Administered 2016-04-03: 4 mg via INTRAVENOUS
  Filled 2016-04-03: qty 2

## 2016-04-03 MED ORDER — ONDANSETRON 4 MG PO TBDP: 4.0000 mg | ORAL_TABLET | Freq: Three times a day (TID) | ORAL | Status: DC | PRN

## 2016-04-03 MED ORDER — NITROFURANTOIN MONOHYD MACRO 100 MG PO CAPS: 100.0000 mg | ORAL_CAPSULE | Freq: Two times a day (BID) | ORAL | Status: DC

## 2016-04-03 NOTE — ED Provider Notes (Signed)
[facility] Emergency Department Provider Note   ____________________________________________  Time seen: Approximately 11:22 AM  I have reviewed the triage vital signs and the nursing notes.   HISTORY  Chief Complaint Dizziness; Diarrhea; and Chills    HPI Selena Walker is a 60 y.o. female with history of hypertension who presents for evaluation of nausea, dry heaves, chills and diarrhea this morning as well as lightheadedness, gradual onset, constant, no modifying factors. Patient reports that over the past week she has had cough productive of yellow sputum as well as subjective fever and chills. This morning she has also been nauseated, feels lightheaded when she attempts to sit up. She has had subjective fevers and chills at home. No chest pain or difficulty breathing.   Past Medical History  Diagnosis Date  . Hypertension   . Glaucoma     There are no active problems to display for this patient.   Past Surgical History  Procedure Laterality Date  . Abdominal hysterectomy    . Colonoscopy  2007  . Colonoscopy with propofol N/A 02/19/2016    Procedure: COLONOSCOPY WITH PROPOFOL;  Surgeon: Manya Silvas, MD;  Location: [facility] ENDOSCOPY;  Service: Endoscopy;  Laterality: N/A;    Current Outpatient Rx  Name  Route  Sig  Dispense  Refill  . chlorthalidone (HYGROTON) 25 MG tablet   Oral   Take 25 mg by mouth daily.         . traMADol (ULTRAM) 50 MG tablet   Oral   Take 1 tablet (50 mg total) by mouth every 6 (six) hours as needed.   20 tablet   0     Allergies Penicillins; Ivp dye; Penicillin v; and Shellfish allergy  History reviewed. No pertinent family history.  Social History Social History  Substance Use Topics  . Smoking status: Never Smoker   . Smokeless tobacco: Never Used  . Alcohol Use: No    Review of Systems Constitutional: +subjective fever/chills Eyes: No visual changes. ENT: No sore throat. Cardiovascular:  Denies chest pain. Respiratory: Denies shortness of breath. Gastrointestinal: No abdominal pain.  +nausea, + vomiting.  + diarrhea.  No constipation. Genitourinary: Negative for dysuria. Musculoskeletal: Negative for back pain. Skin: Negative for rash. Neurological: Negative for headaches, focal weakness or numbness.  10-point ROS otherwise negative.  ____________________________________________   PHYSICAL EXAM:  VITAL SIGNS: ED Triage Vitals  Enc Vitals Group     BP 04/03/16 0933 130/71 mmHg     Pulse Rate 04/03/16 0933 87     Resp 04/03/16 0933 15     Temp 04/03/16 0933 98.2 F (36.8 C)     Temp Source 04/03/16 0933 Oral     SpO2 04/03/16 0933 95 %     Weight 04/03/16 0933 210 lb (95.255 kg)     Height 04/03/16 0933 5\' 6"  (1.676 m)     Head Cir --      Peak Flow --      Pain Score 04/03/16 0937 0     Pain Loc --      Pain Edu? --      Excl. in [place]? --     Constitutional: Alert and oriented. Well appearing and in no acute distress. Eyes: Conjunctivae are normal. PERRL. EOMI. Head: Atraumatic. Nose: No congestion/rhinnorhea. Mouth/Throat: Mucous membranes are moist.  Oropharynx non-erythematous. Neck: No stridor. Supple without meningismus. Cardiovascular: Normal rate, regular rhythm. Grossly normal heart sounds.  Good peripheral circulation. Respiratory: Normal respiratory effort.  No retractions.  Lungs CTAB. Gastrointestinal: Soft and nontender. No distention.  No CVA tenderness. Genitourinary: deferred Musculoskeletal: No lower extremity tenderness nor edema.  No joint effusions. Neurologic:  Normal speech and language. No gross focal neurologic deficits are appreciated. 5 out of 5 strength bilateral upper and lower extremity, sensation intact to light touch throughout, cranial nerves II through XII intact, normal finger-nose-finger. Skin:  Skin is warm, dry and intact. No rash noted. Psychiatric: Mood and affect are normal. Speech and behavior are  normal.  ____________________________________________   LABS (all labs ordered are listed, but only abnormal results are displayed)  Labs Reviewed  COMPREHENSIVE METABOLIC PANEL - Abnormal; Notable for the following:    Glucose, Bld 133 (*)    All other components within normal limits  CBC - Abnormal; Notable for the following:    WBC 11.2 (*)    All other components within normal limits  URINALYSIS COMPLETEWITH MICROSCOPIC ([facility] ONLY) - Abnormal; Notable for the following:    Color, Urine YELLOW (*)    APPearance CLEAR (*)    Hgb urine dipstick 1+ (*)    Leukocytes, UA 3+ (*)    Squamous Epithelial / LPF 0-5 (*)    All other components within normal limits  URINE CULTURE  LIPASE, BLOOD  TROPONIN I   ____________________________________________  EKG  ED ECG REPORT I, Joanne Gavel, the attending physician, personally viewed and interpreted this ECG.   Date: 04/03/2016  EKG Time: 09:43  Rate: 80  Rhythm: normal EKG, normal sinus rhythm  Axis: normal  Intervals:none  ST&T Change: No acute ST elevation.  ____________________________________________  RADIOLOGY  CXR IMPRESSION: No active cardiopulmonary disease. ____________________________________________   PROCEDURES  Procedure(s) performed: None  Critical Care performed: No  ____________________________________________   INITIAL IMPRESSION / ASSESSMENT AND PLAN / ED COURSE  Pertinent labs & imaging results that were available during my care of the patient were reviewed by me and considered in my medical decision making (see chart for details).  Selena Walker is a 60 y.o. female with history of hypertension who presents for evaluation of nausea, dry heaves, chills and diarrhea this morning as well as lightheadedness. On exam, she is generally well-appearing and in no acute distress. Vital signs stable, she is afebrile. She has a benign abdominal examination and no abdominal pain. Intact neurological  examination. We'll obtain screening labs, chest x-ray, treat her symptomatically with IV fluids and Zofran, reassess for disposition.  ----------------------------------------- 2:09 PM on 04/03/2016 ----------------------------------------- Labs reviewed. He received mild leukocytosis, unremarkable CMP and lipase, negative troponin, EKG reassuring. Chest x-ray clear. Urinalysis is consistent with a urinary tract infection, culture sent, we'll treat with Macrobid, normal GFR. The patient reports that she feels much better at this time, no longer lightheaded, she is tolerating by mouth without vomiting. We'll DC with return precautions, close PCP follow-up, she is comfortable with the discharge plan.  ____________________________________________   FINAL CLINICAL IMPRESSION(S) / ED DIAGNOSES  Final diagnoses:  UTI (lower urinary tract infection)  Cough      NEW MEDICATIONS STARTED DURING THIS VISIT:  New Prescriptions   No medications on file     Note:  This document was prepared using Dragon voice recognition software and may include unintentional dictation errors.    Joanne Gavel, MD 04/03/16 [phone]

## 2016-04-03 NOTE — ED Notes (Signed)
States dizziness, diarrhea, cough, vomiting and chills that began this AM, states dark green productive sputum

## 2016-04-05 LAB — URINE CULTURE

## 2016-08-22 ENCOUNTER — Other Ambulatory Visit: Payer: Self-pay | Admitting: Family Medicine

## 2016-08-22 DIAGNOSIS — Z1231 Encounter for screening mammogram for malignant neoplasm of breast: Secondary | ICD-10-CM

## 2016-09-23 ENCOUNTER — Ambulatory Visit
Admission: RE | Admit: 2016-09-23 | Discharge: 2016-09-23 | Disposition: A | Discharge status: Home / Self Care | Payer: Medicare HMO | Source: Ambulatory Visit | Attending: Family Medicine | Admitting: Family Medicine

## 2016-09-23 DIAGNOSIS — Z1231 Encounter for screening mammogram for malignant neoplasm of breast: Secondary | ICD-10-CM | POA: Diagnosis not present

## 2016-12-28 ENCOUNTER — Emergency Department: Payer: Medicare HMO

## 2016-12-28 ENCOUNTER — Observation Stay
Admission: EM | Admit: 2016-12-28 | Discharge: 2016-12-30 | Disposition: A | Discharge status: Home / Self Care | Payer: Medicare HMO | Attending: Internal Medicine | Admitting: Internal Medicine

## 2016-12-28 ENCOUNTER — Observation Stay: Payer: Medicare HMO

## 2016-12-28 DIAGNOSIS — Z880 Allergy status to penicillin: Secondary | ICD-10-CM | POA: Diagnosis not present

## 2016-12-28 DIAGNOSIS — K219 Gastro-esophageal reflux disease without esophagitis: Secondary | ICD-10-CM | POA: Insufficient documentation

## 2016-12-28 DIAGNOSIS — R059 Cough, unspecified: Secondary | ICD-10-CM

## 2016-12-28 DIAGNOSIS — H409 Unspecified glaucoma: Secondary | ICD-10-CM | POA: Diagnosis not present

## 2016-12-28 DIAGNOSIS — R197 Diarrhea, unspecified: Secondary | ICD-10-CM | POA: Insufficient documentation

## 2016-12-28 DIAGNOSIS — Z7982 Long term (current) use of aspirin: Secondary | ICD-10-CM | POA: Insufficient documentation

## 2016-12-28 DIAGNOSIS — G459 Transient cerebral ischemic attack, unspecified: Secondary | ICD-10-CM | POA: Diagnosis not present

## 2016-12-28 DIAGNOSIS — Z91013 Allergy to seafood: Secondary | ICD-10-CM | POA: Diagnosis not present

## 2016-12-28 DIAGNOSIS — R7301 Impaired fasting glucose: Secondary | ICD-10-CM | POA: Insufficient documentation

## 2016-12-28 DIAGNOSIS — D320 Benign neoplasm of cerebral meninges: Secondary | ICD-10-CM | POA: Insufficient documentation

## 2016-12-28 DIAGNOSIS — Z91041 Radiographic dye allergy status: Secondary | ICD-10-CM | POA: Insufficient documentation

## 2016-12-28 DIAGNOSIS — J4520 Mild intermittent asthma, uncomplicated: Secondary | ICD-10-CM | POA: Insufficient documentation

## 2016-12-28 DIAGNOSIS — E785 Hyperlipidemia, unspecified: Secondary | ICD-10-CM | POA: Diagnosis not present

## 2016-12-28 DIAGNOSIS — R05 Cough: Secondary | ICD-10-CM | POA: Diagnosis present

## 2016-12-28 DIAGNOSIS — I10 Essential (primary) hypertension: Secondary | ICD-10-CM | POA: Diagnosis not present

## 2016-12-28 DIAGNOSIS — R531 Weakness: Secondary | ICD-10-CM

## 2016-12-28 HISTORY — DX: Gastro-esophageal reflux disease without esophagitis: K21.9

## 2016-12-28 HISTORY — DX: Unspecified asthma, uncomplicated: J45.909

## 2016-12-28 LAB — BASIC METABOLIC PANEL
ANION GAP: 6 (ref 5–15)
BUN: 12 mg/dL (ref 6–20)
CO2: 32 mmol/L (ref 22–32)
Calcium: 9.2 mg/dL (ref 8.9–10.3)
Chloride: 103 mmol/L (ref 101–111)
Creatinine, Ser: 0.72 mg/dL (ref 0.44–1.00)
GFR calc Af Amer: >60 mL/min (ref >60)
GFR calc non Af Amer: >60 mL/min (ref >60)
GLUCOSE: 125 mg/dL — AB (ref 65–99)
Potassium: 3.8 mmol/L (ref 3.5–5.1)
Sodium: 141 mmol/L (ref 135–145)

## 2016-12-28 LAB — CBC
HCT: 36.1 % (ref 35.0–47.0)
HEMOGLOBIN: 12.2 g/dL (ref 12.0–16.0)
MCH: 27.4 pg (ref 26.0–34.0)
MCHC: 33.7 g/dL (ref 32.0–36.0)
MCV: 81.2 fL (ref 80.0–100.0)
Platelets: 346 10*3/uL (ref 150–440)
RBC: 4.45 MIL/uL (ref 3.80–5.20)
RDW: 14.2 % (ref 11.5–14.5)
WBC: 10.6 10*3/uL (ref 3.6–11.0)

## 2016-12-28 LAB — URINALYSIS, COMPLETE (UACMP) WITH MICROSCOPIC
BILIRUBIN URINE: NEGATIVE
Glucose, UA: NEGATIVE mg/dL
KETONES UR: 5 mg/dL — AB
Nitrite: NEGATIVE
PH: 5 (ref 5.0–8.0)
Protein, ur: NEGATIVE mg/dL
RBC / HPF: NONE SEEN RBC/hpf (ref 0–5)
Specific Gravity, Urine: 1.025 (ref 1.005–1.030)
WBC UA: NONE SEEN WBC/hpf (ref 0–5)

## 2016-12-28 MED ORDER — ASPIRIN EC 81 MG PO TBEC
81.0000 mg | DELAYED_RELEASE_TABLET | Freq: Every day | ORAL | Status: DC
  Administered 2016-12-29 – 2016-12-30 (×2): 81 mg via ORAL
  Filled 2016-12-28 (×2): qty 1

## 2016-12-28 MED ORDER — IPRATROPIUM-ALBUTEROL 0.5-2.5 (3) MG/3ML IN SOLN
3.0000 mL | Freq: Four times a day (QID) | RESPIRATORY_TRACT | Status: DC
  Filled 2016-12-28: qty 3

## 2016-12-28 MED ORDER — ALUM & MAG HYDROXIDE-SIMETH 200-200-20 MG/5ML PO SUSP: 30.0000 mL | Freq: Four times a day (QID) | ORAL | Status: DC | PRN

## 2016-12-28 MED ORDER — ACETAMINOPHEN 650 MG RE SUPP: 650.0000 mg | Freq: Four times a day (QID) | RECTAL | Status: DC | PRN

## 2016-12-28 MED ORDER — ACETAMINOPHEN 325 MG PO TABS: 650.0000 mg | ORAL_TABLET | Freq: Four times a day (QID) | ORAL | Status: DC | PRN

## 2016-12-28 MED ORDER — ENOXAPARIN SODIUM 40 MG/0.4ML ~~LOC~~ SOLN
40.0000 mg | SUBCUTANEOUS | Status: DC
  Administered 2016-12-28 – 2016-12-29 (×2): 40 mg via SUBCUTANEOUS
  Filled 2016-12-28 (×2): qty 0.4

## 2016-12-28 MED ORDER — ASPIRIN 81 MG PO CHEW
324.0000 mg | CHEWABLE_TABLET | Freq: Once | ORAL | Status: AC
  Administered 2016-12-28: 324 mg via ORAL
  Filled 2016-12-28: qty 4

## 2016-12-28 MED ORDER — ATORVASTATIN CALCIUM 20 MG PO TABS
40.0000 mg | ORAL_TABLET | Freq: Every day | ORAL | Status: DC
  Administered 2016-12-28 – 2016-12-29 (×2): 40 mg via ORAL
  Filled 2016-12-28 (×2): qty 2

## 2016-12-28 MED ORDER — LORAZEPAM 2 MG/ML IJ SOLN: 0.5000 mg | Freq: Once | INTRAMUSCULAR | Status: DC

## 2016-12-28 MED ORDER — HYDROCHLOROTHIAZIDE 25 MG PO TABS
25.0000 mg | ORAL_TABLET | Freq: Every day | ORAL | Status: DC
  Administered 2016-12-29 – 2016-12-30 (×2): 25 mg via ORAL
  Filled 2016-12-28 (×2): qty 1

## 2016-12-28 MED ORDER — TIMOLOL MALEATE 0.25 % OP SOLN
1.0000 [drp] | Freq: Two times a day (BID) | OPHTHALMIC | Status: DC
  Administered 2016-12-28 – 2016-12-30 (×4): 1 [drp] via OPHTHALMIC
  Filled 2016-12-28: qty 5

## 2016-12-28 MED ORDER — STROKE: EARLY STAGES OF RECOVERY BOOK
Freq: Once | Status: AC
  Administered 2016-12-28: 23:00:00

## 2016-12-28 NOTE — Care Management Obs Status (Signed)
MEDICARE OBSERVATION STATUS NOTIFICATION   Patient Details  Name: Selena Walker MRN: [id number] Date of Birth: 04/29/1956   Medicare Observation Status Notification Given:  Yes    CrutchfieldAntony Haste, RN 12/28/2016, 6:10 PM

## 2016-12-28 NOTE — ED Notes (Signed)
Pt in for left side weakness that has resolved. States history of tia. No weakness noted on exam, able to walk with easy, steady gait

## 2016-12-28 NOTE — ED Provider Notes (Signed)
[facility] Emergency Department Provider Note  ____________________________________________  Time seen: Approximately 3:31 PM  I have reviewed the triage vital signs and the nursing notes.   HISTORY  Chief Complaint Weakness   HPI Selena Walker is a 61 y.o. female with a history of hypertension and TIA in 1995 who presents for evaluation of headache and right-sided weakness. Patient reports 3 days of a sharp initially mild but got progressively worse over the course of a few days right-sided headache. She reports the headaches current 4/10. This morning and 9 AM patient noticed weakness on her right upper and lower extremities. She went to urgent care and was sent here for evaluation. Patient reports that her weakness has now resolved. Patient has had a TIA in 1995 and takes a baby aspirin daily. She is not a smoker. She denies family history of stroke. Patient denies chest pain, shortness of breath, fever chills, body aches, abdominal pain, nausea vomiting. She denies difficulty walking, slurred speech, facial droop, difficulty finding words.  Past Medical History:  Diagnosis Date  . Asthma   . GERD (gastroesophageal reflux disease)   . Glaucoma   . Hypertension     Patient Active Problem List   Diagnosis Date Noted  . TIA (transient ischemic attack) 12/28/2016    Past Surgical History:  Procedure Laterality Date  . ABDOMINAL HYSTERECTOMY    . BREAST BIOPSY Right    neg  . COLONOSCOPY  2007  . COLONOSCOPY WITH PROPOFOL N/A 02/19/2016   Procedure: COLONOSCOPY WITH PROPOFOL;  Surgeon: Manya Silvas, MD;  Location: [facility] ENDOSCOPY;  Service: Endoscopy;  Laterality: N/A;    Prior to Admission medications   Medication Sig Start Date End Date Taking? Authorizing Provider  albuterol (PROVENTIL HFA;VENTOLIN HFA) 108 (90 Base) MCG/ACT inhaler Inhale 2 puffs into the lungs every 4 (four) hours as needed for wheezing or shortness of breath.   Yes  Historical Provider, MD  alum & mag hydroxide-simeth (MAALOX/MYLANTA) 200-200-20 MG/5ML suspension Take 5 mLs by mouth every 6 (six) hours as needed for indigestion or heartburn.   Yes Historical Provider, MD  chlorthalidone (HYGROTON) 25 MG tablet Take 25 mg by mouth daily.   Yes Historical Provider, MD  dorzolamide-timolol (COSOPT) 22.3-6.8 MG/ML ophthalmic solution Place 1 drop into both eyes 2 (two) times daily. 04/15/16  Yes Historical Provider, MD  ibuprofen (ADVIL,MOTRIN) 200 MG tablet Take 200 mg by mouth every 6 (six) hours as needed.   Yes Historical Provider, MD    Allergies Penicillins; Ivp dye [iodinated diagnostic agents]; Penicillin v; and Shellfish allergy  Family History  Problem Relation Age of Onset  . Breast cancer Sister 32  . CAD Mother   . Diabetes Mother   . Hypertension Mother   . Hypertension Father   . Stomach cancer Father     Social History Social History  Substance Use Topics  . Smoking status: Never Smoker  . Smokeless tobacco: Never Used  . Alcohol use No    Review of Systems  Constitutional: Negative for fever. Eyes: Negative for visual changes. ENT: Negative for sore throat. Neck: No neck pain  Cardiovascular: Negative for chest pain. Respiratory: Negative for shortness of breath. Gastrointestinal: Negative for abdominal pain, vomiting or diarrhea. Genitourinary: Negative for dysuria. Musculoskeletal: Negative for back pain. Skin: Negative for rash. Neurological: + headache and R sided weakness. No numbness. Psych: No SI or HI  ____________________________________________   PHYSICAL EXAM:  VITAL SIGNS: ED Triage Vitals  Enc Vitals Group  BP 12/28/16 1337 (!) 117/56     Pulse Rate 12/28/16 1337 (!) 101     Resp 12/28/16 1337 16     Temp 12/28/16 1337 98.3 F (36.8 C)     Temp Source 12/28/16 1337 Oral     SpO2 12/28/16 1337 96 %     Weight 12/28/16 1335 210 lb (95.3 kg)     Height 12/28/16 1335 5\' 6"  (1.676 m)     Head  Circumference --      Peak Flow --      Pain Score --      Pain Loc --      Pain Edu? --      Excl. in [place]? --     Constitutional: Alert and oriented. Well appearing and in no apparent distress. HEENT:      Head: Normocephalic and atraumatic.         Eyes: Conjunctivae are normal. Sclera is non-icteric. EOMI. PERRL      Mouth/Throat: Mucous membranes are moist.       Neck: Supple with no signs of meningismus. Cardiovascular: Regular rate and rhythm. No murmurs, gallops, or rubs. 2+ symmetrical distal pulses are present in all extremities. No JVD. Respiratory: Normal respiratory effort. Lungs are clear to auscultation bilaterally. No wheezes, crackles, or rhonchi.  Gastrointestinal: Soft, non tender, and non distended with positive bowel sounds. No rebound or guarding. Musculoskeletal: Nontender with normal range of motion in all extremities. No edema, cyanosis, or erythema of extremities. Neurologic: Normal speech and language. A & O x3, PERRL, no nystagmus, CN II-XII intact, motor testing reveals good tone and bulk throughout. There is no evidence of pronator drift or dysmetria. Muscle strength is 5/5 throughout. Deep tendon reflexes are 2+ throughout with downgoing toes. Sensory examination is intact. Gait is normal. Skin: Skin is warm, dry and intact. No rash noted. Psychiatric: Mood and affect are normal. Speech and behavior are normal.  ____________________________________________   LABS (all labs ordered are listed, but only abnormal results are displayed)  Labs Reviewed  BASIC METABOLIC PANEL - Abnormal; Notable for the following:       Result Value   Glucose, Bld 125 (*)    All other components within normal limits  URINALYSIS, COMPLETE (UACMP) WITH MICROSCOPIC - Abnormal; Notable for the following:    Color, Urine YELLOW (*)    APPearance TURBID (*)    Hgb urine dipstick MODERATE (*)    Ketones, ur 5 (*)    Leukocytes, UA TRACE (*)    Bacteria, UA RARE (*)    Squamous  Epithelial / LPF 0-5 (*)    All other components within normal limits  URINE CULTURE  GASTROINTESTINAL PANEL BY PCR, STOOL (REPLACES STOOL CULTURE)  C DIFFICILE QUICK SCREEN W PCR REFLEX  CBC  BASIC METABOLIC PANEL  CBC  LIPID PANEL  HEMOGLOBIN A1C  CBG MONITORING, ED   ____________________________________________  EKG  ED ECG REPORT I, [name], the attending physician, personally viewed and interpreted this ECG.  Normal sinus rhythm, rate of 97, normal intervals, normal axis, no ST elevations or depressions, flattening T waves on inferior and lateral leads. Flattening on lateral leads is new from prior ____________________________________________  RADIOLOGY  Head CT: Negative head CT. No intracranial mass, hemorrhage or edema. ____________________________________________   PROCEDURES  Procedure(s) performed: None Procedures Critical Care performed:  None ____________________________________________   INITIAL IMPRESSION / ASSESSMENT AND PLAN / ED COURSE  61 y.o. female with a history of hypertension and TIA in  1995 who presents for evaluation of headache and right-sided weakness. Patient's symptoms have now resolved. She is neurologically intact. Continues to endorse a mild right-sided headache. We'll send her for CT scan. Sounds like patient had another TIA. Has not been evaluated for TIA since 1995. Plan to admit.   Clinical Course as of Dec 28 2341  Sat Dec 28, 2016  1719 CT head negative, Patient given ASA. Will admit to Hospitalist for TIA work up.   [CV]    Clinical Course User Index [CV] [name], MD    Pertinent labs & imaging results that were available during my care of the patient were reviewed by me and considered in my medical decision making (see chart for details).    ____________________________________________   FINAL CLINICAL IMPRESSION(S) / ED DIAGNOSES  Final diagnoses:  Transient cerebral ischemia, unspecified type        NEW MEDICATIONS STARTED DURING THIS VISIT:  Current Discharge Medication List       Note:  This document was prepared using Dragon voice recognition software and may include unintentional dictation errors.    [name], MD 12/28/16 [phone]

## 2016-12-28 NOTE — H&P (Signed)
[place] [facility] [place] NAME: Selena Walker    MR#:  [id number]  DATE OF BIRTH:  01-27-1956  DATE OF ADMISSION:  12/28/2016  PRIMARY CARE PHYSICIAN: Petra Kuba, MD   REQUESTING/REFERRING PHYSICIAN: Dr Gonzella Lex  CHIEF COMPLAINT:   Chief Complaint  Patient presents with  . Weakness    HISTORY OF PRESENT ILLNESS:  Selena Walker  is a 61 y.o. female presents with weakness. She states the weakness started 9 AM this morning and went away around 3:30 this afternoon. She initially went to the urgent care and was divergent over to the emergency room. She describes the weakness in her left arm and left leg felt numb and weak. All of her symptoms have resolved at this time. She also did have pain at the top of her head since Thursday. She has had a dry cough and she's also had diarrhea 3 or 4 times a day for the last 3 months.  PAST MEDICAL HISTORY:   Past Medical History:  Diagnosis Date  . Asthma   . GERD (gastroesophageal reflux disease)   . Glaucoma   . Hypertension     PAST SURGICAL HISTORY:   Past Surgical History:  Procedure Laterality Date  . ABDOMINAL HYSTERECTOMY    . BREAST BIOPSY Right    neg  . COLONOSCOPY  2007  . COLONOSCOPY WITH PROPOFOL N/A 02/19/2016   Procedure: COLONOSCOPY WITH PROPOFOL;  Surgeon: Manya Silvas, MD;  Location: [facility] ENDOSCOPY;  Service: Endoscopy;  Laterality: N/A;    SOCIAL HISTORY:   Social History  Substance Use Topics  . Smoking status: Never Smoker  . Smokeless tobacco: Never Used  . Alcohol use No    FAMILY HISTORY:   Family History  Problem Relation Age of Onset  . Breast cancer Sister 52  . CAD Mother   . Diabetes Mother   . Hypertension Mother   . Hypertension Father   . Stomach cancer Father     DRUG ALLERGIES:   Allergies  Allergen Reactions  . Penicillins   . Ivp Dye [Iodinated Diagnostic Agents] Rash  . Penicillin V Rash  . Shellfish Allergy Rash     REVIEW OF SYSTEMS:  CONSTITUTIONAL: No fever. Positive for chills. Positive for weight gain. Positive for weakness on the left side. EYES: Patient wears glasses and has glaucoma EARS, NOSE, AND THROAT: No tinnitus or ear pain. No sore throat. Positive for runny nose RESPIRATORY: Positive for cough. No shortness of breath, wheezing or hemoptysis.  CARDIOVASCULAR: No chest pain. Positive for palpitations. GASTROINTESTINAL: No nausea, vomiting. Positive for diarrhea 3-4 times a day for the past 3 months. Positive for occasional abdominal pain. No blood in bowel movements GENITOURINARY: No dysuria, hematuria.  ENDOCRINE: No polyuria, nocturia,  HEMATOLOGY: No anemia, easy bruising or bleeding SKIN: No rash or lesion. MUSCULOSKELETAL: No joint pain or arthritis.   NEUROLOGIC: No tingling, numbness, weakness.  PSYCHIATRY: History of depression.   MEDICATIONS AT HOME:   Prior to Admission medications   Medication Sig Start Date End Date Taking? Authorizing Provider  chlorthalidone (HYGROTON) 25 MG tablet Take 25 mg by mouth daily.    Historical Provider, MD  nitrofurantoin, macrocrystal-monohydrate, (MACROBID) 100 MG capsule Take 1 capsule (100 mg total) by mouth 2 (two) times daily. 04/03/16   Joanne Gavel, MD  ondansetron (ZOFRAN ODT) 4 MG disintegrating tablet Take 1 tablet (4 mg total) by mouth every 8 (eight) hours as needed for nausea or vomiting. 04/03/16  Joanne Gavel, MD    Medication reconciliation process still undergoing. Patient mentioned she is on hydrochlorothiazide 25 mg daily, timolol eyedrops twice a day. Mylanta when necessary, albuterol inhaler when necessary, sometimes Tylenol and sometimes ibuprofen  VITAL SIGNS:  Blood pressure 136/74, pulse 85, temperature 98.3 F (36.8 C), temperature source Oral, resp. rate (!) 22, height 5\' 6"  (1.676 m), weight 95.3 kg (210 lb), SpO2 100 %.  PHYSICAL EXAMINATION:  GENERAL:  61 y.o.-year-old patient lying in the bed with no  acute distress.  EYES: Pupils equal, round, reactive to light and accommodation. No scleral icterus. Extraocular muscles intact.  HEENT: Head atraumatic, normocephalic. Oropharynx and nasopharynx clear.  NECK:  Supple, no jugular venous distention. No thyroid enlargement, no tenderness.  LUNGS: Normal breath sounds bilaterally, no wheezing, rales,rhonchi or crepitation. No use of accessory muscles of respiration.  CARDIOVASCULAR: S1, S2 normal. No murmurs, rubs, or gallops.  ABDOMEN: Soft, nontender, nondistended. Bowel sounds present. No organomegaly or mass.  EXTREMITIES: No pedal edema, cyanosis, or clubbing.  NEUROLOGIC: Cranial nerves II through XII are intact. Muscle strength 5/5 in all extremities. Sensation intact. Gait not checked. Babinski negative. PSYCHIATRIC: The patient is alert and oriented x 3.  SKIN: No rash, lesion, or ulcer.   LABORATORY PANEL:   CBC  Recent Labs Lab 12/28/16 1336  WBC 10.6  HGB 12.2  HCT 36.1  PLT 346   ------------------------------------------------------------------------------------------------------------------  Chemistries   Recent Labs Lab 12/28/16 1336  NA 141  K 3.8  CL 103  CO2 32  GLUCOSE 125*  BUN 12  CREATININE 0.72  CALCIUM 9.2   ------------------------------------------------------------------------------------------------------------------   RADIOLOGY:  Ct Head Wo Contrast  Result Date: 12/28/2016 CLINICAL DATA:  New left-sided weakness for 3 days with headache. History of prior CVA with right-sided weakness. EXAM: CT HEAD WITHOUT CONTRAST TECHNIQUE: Contiguous axial images were obtained from the base of the skull through the vertex without intravenous contrast. COMPARISON:  CT head dated 08/04/2006 and brain MRI dated 08/03/2012. FINDINGS: Brain: Ventricles are normal in size and configuration. There is mild generalized parenchymal atrophy with commensurate dilatation of the sulci. There is no mass, hemorrhage, edema  or other evidence of acute parenchymal abnormality. No extra-axial hemorrhage. Vascular: No hyperdense vessel or unexpected calcification. Skull: Normal. Negative for fracture or focal lesion. Sinuses/Orbits: No acute finding. Other: None. IMPRESSION: Negative head CT.  No intracranial mass, hemorrhage or edema. Electronically Signed   By: Franki Cabot M.D.   On: 12/28/2016 16:18    EKG:   Normal sinus rhythm 97 bpm.  IMPRESSION AND PLAN:   1. Transient ischemic attack with transient left-sided weakness and numbness. Place in observation. MRI of the brain and carotid ultrasound and echocardiogram will be obtained. Monitor on telemetry. Start aspirin and Lipitor. Physical therapy evaluation. Check a lipid profile. 2. Essential hypertension. Continue hydrochlorothiazide starting tomorrow 3. GERD on Mylanta when necessary 4. Glaucoma unspecified continue timolol 5. Impaired fasting glucose check a hemoglobin A1c 6. Cough. Obtain a chest x-ray PA and lateral 7. Diarrhea for the last 3 months. Send off stool studies and C. difficile to make sure nothing else is going on.  All the records are reviewed and case discussed with ED provider. Management plans discussed with the patient, family and they are in agreement.  CODE STATUS: Full code  TOTAL TIME TAKING CARE OF THIS PATIENT: 50 minutes.    Loletha Grayer M.D on 12/28/2016 at 5:54 PM  Between 7am to 6pm - Pager - [phone]  After  6pm call admission pager [phone]  [facility]  [phone]  CC: Primary care physician; Petra Kuba, MD

## 2016-12-28 NOTE — ED Triage Notes (Signed)
Pt c/o left sided weakness since Thursday. Has had tias in the past. Reports has been having trouble with memory as well. Alert and oriented x4. Pt right side hand grip weaker than left. Pt reports that is normal. (Hand surgery on right hand). VS stable.

## 2016-12-29 ENCOUNTER — Observation Stay: Payer: Medicare HMO

## 2016-12-29 DIAGNOSIS — G451 Carotid artery syndrome (hemispheric): Secondary | ICD-10-CM | POA: Diagnosis not present

## 2016-12-29 LAB — GASTROINTESTINAL PANEL BY PCR, STOOL (REPLACES STOOL CULTURE)

## 2016-12-29 LAB — BASIC METABOLIC PANEL
ANION GAP: 7 (ref 5–15)
BUN: 14 mg/dL (ref 6–20)
CO2: 31 mmol/L (ref 22–32)
Calcium: 8.9 mg/dL (ref 8.9–10.3)
Chloride: 101 mmol/L (ref 101–111)
Creatinine, Ser: 0.8 mg/dL (ref 0.44–1.00)
Glucose, Bld: 129 mg/dL — ABNORMAL HIGH (ref 65–99)
Potassium: 3.5 mmol/L (ref 3.5–5.1)
SODIUM: 139 mmol/L (ref 135–145)

## 2016-12-29 LAB — LIPID PANEL
Cholesterol: 218 mg/dL — ABNORMAL HIGH (ref 0–200)
HDL: 46 mg/dL (ref >40)
LDL Cholesterol: 153 mg/dL — ABNORMAL HIGH (ref 0–99)
Total CHOL/HDL Ratio: 4.7 RATIO
Triglycerides: 93 mg/dL (ref <150)
VLDL: 19 mg/dL (ref 0–40)

## 2016-12-29 LAB — CBC
HEMATOCRIT: 35.3 % (ref 35.0–47.0)
Hemoglobin: 11.5 g/dL — ABNORMAL LOW (ref 12.0–16.0)
MCH: 26.8 pg (ref 26.0–34.0)
MCHC: 32.7 g/dL (ref 32.0–36.0)
MCV: 82 fL (ref 80.0–100.0)
PLATELETS: 350 10*3/uL (ref 150–440)
RBC: 4.3 MIL/uL (ref 3.80–5.20)
RDW: 14.6 % — AB (ref 11.5–14.5)
WBC: 8.4 10*3/uL (ref 3.6–11.0)

## 2016-12-29 LAB — C DIFFICILE QUICK SCREEN W PCR REFLEX
C DIFFICILE (CDIFF) TOXIN: NEGATIVE
C DIFFICLE (CDIFF) ANTIGEN: NEGATIVE
C Diff interpretation: NOT DETECTED

## 2016-12-29 LAB — HEMOGLOBIN A1C
Hgb A1c MFr Bld: 5.8 % — ABNORMAL HIGH (ref 4.8–5.6)
MEAN PLASMA GLUCOSE: 120 mg/dL

## 2016-12-29 MED ORDER — IPRATROPIUM-ALBUTEROL 0.5-2.5 (3) MG/3ML IN SOLN: 3.0000 mL | Freq: Four times a day (QID) | RESPIRATORY_TRACT | Status: DC | PRN

## 2016-12-29 MED ORDER — ATORVASTATIN CALCIUM 40 MG PO TABS: 40.0000 mg | ORAL_TABLET | Freq: Every day | ORAL | 0 refills | Status: DC

## 2016-12-29 MED ORDER — ASPIRIN 81 MG PO TBEC: 81.0000 mg | DELAYED_RELEASE_TABLET | Freq: Every day | ORAL | Status: AC

## 2016-12-29 MED ORDER — LORAZEPAM 1 MG PO TABS
1.0000 mg | ORAL_TABLET | Freq: Once | ORAL | Status: AC
  Administered 2016-12-29: 17:00:00 1 mg via ORAL
  Filled 2016-12-29: qty 1

## 2016-12-29 MED ORDER — GADOBENATE DIMEGLUMINE 529 MG/ML IV SOLN
20.0000 mL | Freq: Once | INTRAVENOUS | Status: AC | PRN
  Administered 2016-12-29: 18:00:00 20 mL via INTRAVENOUS

## 2016-12-29 NOTE — Progress Notes (Signed)
PT Cancellation Note  Patient Details Name: Selena Walker MRN: [id number] DOB: 31-Jul-1956   Cancelled Treatment:    Reason Eval/Treat Not Completed: PT screened, no needs identified, will sign off (Pt reports to have returned to her baseline, no longer having symptoms. AMB 439ft with PT and without weakness or balance deficits. PT signing off at this time. No additional skilled PT services needed. )  1:13 PM, 12/29/16 Etta Grandchild, PT, DPT Physical Therapist - [place] [phone] (mobile)

## 2016-12-29 NOTE — Progress Notes (Signed)
MRI completed with report reviewed. Dr. Darvin Neighbours advised discharge status would be reviewed after MRI with ECHO not done yet. Dr. Darvin Neighbours now off duty. Paged Dr. Leslye Peer with report given; he will assess.

## 2016-12-29 NOTE — Discharge Instructions (Signed)
Heart healthy diet. °Activity as tolerated. °

## 2016-12-29 NOTE — Progress Notes (Signed)
RTC from Dr. Darvin Neighbours with pt to stay until ECHO completed tomorrow.

## 2016-12-29 NOTE — Progress Notes (Signed)
[facility] at [place] NAME: Selena Walker    MR#:  [id number]  DATE OF BIRTH:  [birth year]  SUBJECTIVE:  CHIEF COMPLAINT:   Chief Complaint  Patient presents with  . Weakness   Symptoms resolved.  MRI showed no stroke.  ECHO not done. Ordered yesterday.  REVIEW OF SYSTEMS:    Review of Systems  Constitutional: Negative for chills and fever.  HENT: Negative for sore throat.   Eyes: Negative for blurred vision, double vision and pain.  Respiratory: Negative for cough, hemoptysis, shortness of breath and wheezing.   Cardiovascular: Negative for chest pain, palpitations, orthopnea and leg swelling.  Gastrointestinal: Negative for abdominal pain, constipation, diarrhea, heartburn, nausea and vomiting.  Genitourinary: Negative for dysuria and hematuria.  Musculoskeletal: Negative for back pain and joint pain.  Skin: Negative for rash.  Neurological: Negative for sensory change, speech change, focal weakness and headaches.  Endo/Heme/Allergies: Does not bruise/bleed easily.  Psychiatric/Behavioral: Negative for depression. The patient is not nervous/anxious.     DRUG ALLERGIES:   Allergies  Allergen Reactions  . Penicillins   . Ivp Dye [Iodinated Diagnostic Agents] Rash  . Penicillin V Rash  . Shellfish Allergy Rash    VITALS:  Blood pressure (!) 127/52, pulse 90, temperature 97.3 F (36.3 C), temperature source Oral, resp. rate 20, height 5\' 6"  (1.676 m), weight 98 kg (216 lb), SpO2 98 %.  PHYSICAL EXAMINATION:   Physical Exam  GENERAL:  61 y.o.-year-old patient lying in the bed with no acute distress.  EYES: Pupils equal, round, reactive to light and accommodation. No scleral icterus. Extraocular muscles intact.  HEENT: Head atraumatic, normocephalic. Oropharynx and nasopharynx clear.  NECK:  Supple, no jugular venous distention. No thyroid enlargement, no tenderness.  LUNGS: Normal breath sounds bilaterally, no wheezing,  rales, rhonchi. No use of accessory muscles of respiration.  CARDIOVASCULAR: S1, S2 normal. No murmurs, rubs, or gallops.  ABDOMEN: Soft, nontender, nondistended. Bowel sounds present. No organomegaly or mass.  EXTREMITIES: No cyanosis, clubbing or edema b/l.    NEUROLOGIC: Cranial nerves II through XII are intact. No focal Motor or sensory deficits b/l.   PSYCHIATRIC: The patient is alert and oriented x 3.  SKIN: No obvious rash, lesion, or ulcer.   LABORATORY PANEL:   CBC  Recent Labs Lab 12/29/16 0406  WBC 8.4  HGB 11.5*  HCT 35.3  PLT 350   ------------------------------------------------------------------------------------------------------------------ Chemistries   Recent Labs Lab 12/29/16 0406  NA 139  K 3.5  CL 101  CO2 31  GLUCOSE 129*  BUN 14  CREATININE 0.80  CALCIUM 8.9   ------------------------------------------------------------------------------------------------------------------  Cardiac Enzymes No results for input(s): TROPONINI in the last 168 hours. ------------------------------------------------------------------------------------------------------------------  RADIOLOGY:  Dg Chest 2 View  Result Date: 12/28/2016 CLINICAL DATA:  Left-sided weakness. EXAM: CHEST  2 VIEW COMPARISON:  Apr 03, 2016 FINDINGS: The heart size and mediastinal contours are within normal limits. Both lungs are clear. The visualized skeletal structures are unremarkable. IMPRESSION: No active cardiopulmonary disease. Electronically Signed   By: Dorise Bullion III M.D   On: 12/28/2016 18:19   Ct Head Wo Contrast  Result Date: 12/28/2016 CLINICAL DATA:  New left-sided weakness for 3 days with headache. History of prior CVA with right-sided weakness. EXAM: CT HEAD WITHOUT CONTRAST TECHNIQUE: Contiguous axial images were obtained from the base of the skull through the vertex without intravenous contrast. COMPARISON:  CT head dated 08/04/2006 and brain MRI dated 08/03/2012.  FINDINGS: Brain: Ventricles are normal  in size and configuration. There is mild generalized parenchymal atrophy with commensurate dilatation of the sulci. There is no mass, hemorrhage, edema or other evidence of acute parenchymal abnormality. No extra-axial hemorrhage. Vascular: No hyperdense vessel or unexpected calcification. Skull: Normal. Negative for fracture or focal lesion. Sinuses/Orbits: No acute finding. Other: None. IMPRESSION: Negative head CT.  No intracranial mass, hemorrhage or edema. Electronically Signed   By: Franki Cabot M.D.   On: 12/28/2016 16:18   Mr Jeri Cos [id number] Contrast  Result Date: 12/29/2016 CLINICAL DATA:  Weakness starting at 9 a.m. this morning. Numbness in the left arm and leg. Symptoms have resolved. EXAM: MRI HEAD WITHOUT AND WITH CONTRAST TECHNIQUE: Multiplanar, multiecho pulse sequences of the brain and surrounding structures were obtained without and with intravenous contrast. CONTRAST:  80mL MULTIHANCE GADOBENATE DIMEGLUMINE 529 MG/ML IV SOLN COMPARISON:  08/03/2012 FINDINGS: Brain: No acute infarction, hemorrhage, hydrocephalus, extra-axial collection or mass lesion. Mild white matter disease for age with predominantly peripheral small FLAIR hyperintensities in the cerebral white matter. No significant progression compared to prior. There is a 6 mm right parafalcine enhancing mass along the lobe posterior tentorium which is T2 isointense and stable from 2013. Vascular: Negative Skull and upper cervical spine: Negative Sinuses/Orbits: Negative IMPRESSION: 1. No acute finding. 2. Mild white matter disease, likely microvascular ischemia, stable from 2013. 3. 6 mm parafalcine meningioma, stable since 2013. Electronically Signed   By: Monte Fantasia M.D.   On: 12/29/2016 18:06   US Carotid Bilateral  Result Date: 12/29/2016 CLINICAL DATA:  Left-sided weakness for 1 day. History of hypertension, stroke and hyperlipidemia. EXAM: BILATERAL CAROTID DUPLEX ULTRASOUND TECHNIQUE: Pearline Cables  scale imaging, color Doppler and duplex ultrasound were performed of bilateral carotid and vertebral arteries in the neck. COMPARISON:  None. FINDINGS: Criteria: Quantification of carotid stenosis is based on velocity parameters that correlate the residual internal carotid diameter with NASCET-based stenosis levels, using the diameter of the distal internal carotid lumen as the denominator for stenosis measurement. The following velocity measurements were obtained: RIGHT ICA:  64/17 cm/sec CCA:  0000000 cm/sec SYSTOLIC ICA/CCA RATIO:  0.6 DIASTOLIC ICA/CCA RATIO:  0.9 ECA:  128 cm/sec LEFT ICA:  54/12 cm/sec CCA:  99991111 cm/sec SYSTOLIC ICA/CCA RATIO:  0.6 DIASTOLIC ICA/CCA RATIO:  0.9 ECA:  129 cm/sec RIGHT CAROTID ARTERY: There is a minimal amount of intimal wall thickening/atherosclerotic plaque within the right carotid bulb (image 16), not resulting in elevated peak systolic velocities within the interrogated course of the right internal carotid artery to suggest a hemodynamically significant stenosis. RIGHT VERTEBRAL ARTERY:  Antegrade Flow LEFT CAROTID ARTERY: There is a minimal amount of eccentric mixed echogenic plaque within the left carotid bulb (Image 51), not resulting in elevated peak systolic velocities within the interrogated course of the left internal carotid artery to suggest a hemodynamically significant stenosis. LEFT VERTEBRAL ARTERY:  Antegrade Flow IMPRESSION: Minimal amount of bilateral atherosclerotic plaque not resulting in a hemodynamically significant stenosis within either internal carotid artery. Electronically Signed   By: Sandi Mariscal M.D.   On: 12/29/2016 08:23     ASSESSMENT AND PLAN:   * TIA with left weakness resolved MRI showed no CVA. Stable meningioma. Carotids- No significant stenosis. On ASA at home Added statin. ECHO NOT DONE. Wait for echo to be done tomorrow. Tele monitoring Neuro checks Appreciate neurology input  * HTN Well controlled  * DVT  prophylaxis Lovenox  All the records are reviewed and case discussed with Care Management/Social Workerr. Management plans discussed with  the patient, family and they are in agreement.  CODE STATUS: FULL CODE  DVT Prophylaxis: SCDs  TOTAL TIME TAKING CARE OF THIS PATIENT: 30 minutes.   POSSIBLE D/C IN 1-2 DAYS, DEPENDING ON CLINICAL CONDITION.  Hillary Bow R M.D on 12/29/2016 at 8:19 PM  Between 7am to 6pm - Pager - [phone]  After 6pm go to [email or web address] - password [email or web address] [facility]  [facility]  Office  [phone]  CC: Primary care physician; Petra Kuba, MD  Note: This dictation was prepared with Dragon dictation along with smaller phrase technology. Any transcriptional errors that result from this process are unintentional.

## 2016-12-29 NOTE — Consult Note (Signed)
Reason for Consult:LUE and LLE weakness  Referring Physician: Dr. Darvin Neighbours    CC: TIA  HPI: Selena Walker is an 61 y.o. female presents with L sided weakness that started yesterday and was transient. Currently resolved an pt has no complaints.  Currently at baseline.  No anti platelet use at home.      Past Medical History:  Diagnosis Date  . Asthma   . GERD (gastroesophageal reflux disease)   . Glaucoma   . Hypertension     Past Surgical History:  Procedure Laterality Date  . ABDOMINAL HYSTERECTOMY    . BREAST BIOPSY Right    neg  . COLONOSCOPY  2007  . COLONOSCOPY WITH PROPOFOL N/A 02/19/2016   Procedure: COLONOSCOPY WITH PROPOFOL;  Surgeon: Manya Silvas, MD;  Location: [facility] ENDOSCOPY;  Service: Endoscopy;  Laterality: N/A;    Family History  Problem Relation Age of Onset  . Breast cancer Sister 56  . CAD Mother   . Diabetes Mother   . Hypertension Mother   . Hypertension Father   . Stomach cancer Father     Social History:  reports that she has never smoked. She has never used smokeless tobacco. She reports that she does not drink alcohol or use drugs.  Allergies  Allergen Reactions  . Penicillins   . Ivp Dye [Iodinated Diagnostic Agents] Rash  . Penicillin V Rash  . Shellfish Allergy Rash    Medications: I have reviewed the patient's current medications.  ROS: History obtained from the patient  General ROS: negative for - chills, fatigue, fever, night sweats, weight gain or weight loss Psychological ROS: negative for - behavioral disorder, hallucinations, memory difficulties, mood swings or suicidal ideation Ophthalmic ROS: negative for - blurry vision, double vision, eye pain or loss of vision ENT ROS: negative for - epistaxis, nasal discharge, oral lesions, sore throat, tinnitus or vertigo Allergy and Immunology ROS: negative for - hives or itchy/watery eyes Hematological and Lymphatic ROS: negative for - bleeding problems, bruising or swollen lymph  nodes Endocrine ROS: negative for - galactorrhea, hair pattern changes, polydipsia/polyuria or temperature intolerance Respiratory ROS: negative for - cough, hemoptysis, shortness of breath or wheezing Cardiovascular ROS: negative for - chest pain, dyspnea on exertion, edema or irregular heartbeat Gastrointestinal ROS: negative for - abdominal pain, diarrhea, hematemesis, nausea/vomiting or stool incontinence Genito-Urinary ROS: negative for - dysuria, hematuria, incontinence or urinary frequency/urgency Musculoskeletal ROS: negative for - joint swelling or muscular weakness Neurological ROS: as noted in HPI Dermatological ROS: negative for rash and skin lesion changes  Physical Examination: Blood pressure (!) 117/55, pulse 90, temperature 97.9 F (36.6 C), temperature source Oral, resp. rate 20, height 5\' 6"  (1.676 m), weight 98 kg (216 lb), SpO2 95 %.  Neurological Examination Mental Status: Alert, oriented, thought content appropriate.  Speech fluent without evidence of aphasia.  Able to follow 3 step commands without difficulty. Cranial Nerves: II: Discs flat bilaterally; Visual fields grossly normal, pupils equal, round, reactive to light and accommodation III,IV, VI: ptosis not present, extra-ocular motions intact bilaterally V,VII: smile symmetric, facial light touch sensation normal bilaterally VIII: hearing normal bilaterally IX,X: gag reflex present XI: bilateral shoulder shrug XII: midline tongue extension Motor: Right : Upper extremity   5/5    Left:     Upper extremity   5/5  Lower extremity   5/5     Lower extremity   5/5 Tone and bulk:normal tone throughout; no atrophy noted Sensory: Pinprick and light touch intact throughout, bilaterally Deep  Tendon Reflexes: 2+ and symmetric throughout Plantars: Right: downgoing   Left: downgoing Cerebellar: normal finger-to-nose, normal rapid alternating movements and normal heel-to-shin test Gait: not tested      Laboratory  Studies:   Basic Metabolic Panel:  Recent Labs Lab 12/28/16 1336 12/29/16 0406  NA 141 139  K 3.8 3.5  CL 103 101  CO2 32 31  GLUCOSE 125* 129*  BUN 12 14  CREATININE 0.72 0.80  CALCIUM 9.2 8.9    Liver Function Tests: No results for input(s): AST, ALT, ALKPHOS, BILITOT, PROT, ALBUMIN in the last 168 hours. No results for input(s): LIPASE, AMYLASE in the last 168 hours. No results for input(s): AMMONIA in the last 168 hours.  CBC:  Recent Labs Lab 12/28/16 1336 12/29/16 0406  WBC 10.6 8.4  HGB 12.2 11.5*  HCT 36.1 35.3  MCV 81.2 82.0  PLT 346 350    Cardiac Enzymes: No results for input(s): CKTOTAL, CKMB, CKMBINDEX, TROPONINI in the last 168 hours.  BNP: Invalid input(s): POCBNP  CBG: No results for input(s): GLUCAP in the last 168 hours.  Microbiology: Results for orders placed or performed during the hospital encounter of 12/28/16  Gastrointestinal Panel by PCR , Stool     Status: None   Collection Time: 12/28/16 11:14 PM  Result Value Ref Range Status   Campylobacter species NOT DETECTED NOT DETECTED Final   Plesimonas shigelloides NOT DETECTED NOT DETECTED Final   Salmonella species NOT DETECTED NOT DETECTED Final   Yersinia enterocolitica NOT DETECTED NOT DETECTED Final   Vibrio species NOT DETECTED NOT DETECTED Final   Vibrio cholerae NOT DETECTED NOT DETECTED Final   Enteroaggregative E coli (EAEC) NOT DETECTED NOT DETECTED Final   Enteropathogenic E coli (EPEC) NOT DETECTED NOT DETECTED Final   Enterotoxigenic E coli (ETEC) NOT DETECTED NOT DETECTED Final   Shiga like toxin producing E coli (STEC) NOT DETECTED NOT DETECTED Final   Shigella/Enteroinvasive E coli (EIEC) NOT DETECTED NOT DETECTED Final   Cryptosporidium NOT DETECTED NOT DETECTED Final   Cyclospora cayetanensis NOT DETECTED NOT DETECTED Final   Entamoeba histolytica NOT DETECTED NOT DETECTED Final   Giardia lamblia NOT DETECTED NOT DETECTED Final   Adenovirus F40/41 NOT DETECTED NOT  DETECTED Final   Astrovirus NOT DETECTED NOT DETECTED Final   Norovirus GI/GII NOT DETECTED NOT DETECTED Final   Rotavirus A NOT DETECTED NOT DETECTED Final   Sapovirus (I, II, IV, and V) NOT DETECTED NOT DETECTED Final  C difficile quick scan w PCR reflex     Status: None   Collection Time: 12/28/16 11:14 PM  Result Value Ref Range Status   C Diff antigen NEGATIVE NEGATIVE Final   C Diff toxin NEGATIVE NEGATIVE Final   C Diff interpretation No C. difficile detected.  Final    Coagulation Studies: No results for input(s): LABPROT, INR in the last 72 hours.  Urinalysis:  Recent Labs Lab 12/28/16 1342  COLORURINE YELLOW*  LABSPEC 1.025  PHURINE 5.0  GLUCOSEU NEGATIVE  HGBUR MODERATE*  BILIRUBINUR NEGATIVE  KETONESUR 5*  PROTEINUR NEGATIVE  NITRITE NEGATIVE  LEUKOCYTESUR TRACE*    Lipid Panel:     Component Value Date/Time   CHOL 218 (H) 12/29/2016 0406   TRIG 93 12/29/2016 0406   HDL 46 12/29/2016 0406   CHOLHDL 4.7 12/29/2016 0406   VLDL 19 12/29/2016 0406   LDLCALC 153 (H) 12/29/2016 0406    HgbA1C:  Lab Results  Component Value Date   HGBA1C 5.8 (H) 12/28/2016  Urine Drug Screen:  No results found for: LABOPIA, COCAINSCRNUR, LABBENZ, AMPHETMU, THCU, LABBARB  Alcohol Level: No results for input(s): ETH in the last 168 hours.  Other results: EKG: normal EKG, normal sinus rhythm, unchanged from previous tracings.  Imaging: Dg Chest 2 View  Result Date: 12/28/2016 CLINICAL DATA:  Left-sided weakness. EXAM: CHEST  2 VIEW COMPARISON:  Apr 03, 2016 FINDINGS: The heart size and mediastinal contours are within normal limits. Both lungs are clear. The visualized skeletal structures are unremarkable. IMPRESSION: No active cardiopulmonary disease. Electronically Signed   By: Dorise Bullion III M.D   On: 12/28/2016 18:19   Ct Head Wo Contrast  Result Date: 12/28/2016 CLINICAL DATA:  New left-sided weakness for 3 days with headache. History of prior CVA with  right-sided weakness. EXAM: CT HEAD WITHOUT CONTRAST TECHNIQUE: Contiguous axial images were obtained from the base of the skull through the vertex without intravenous contrast. COMPARISON:  CT head dated 08/04/2006 and brain MRI dated 08/03/2012. FINDINGS: Brain: Ventricles are normal in size and configuration. There is mild generalized parenchymal atrophy with commensurate dilatation of the sulci. There is no mass, hemorrhage, edema or other evidence of acute parenchymal abnormality. No extra-axial hemorrhage. Vascular: No hyperdense vessel or unexpected calcification. Skull: Normal. Negative for fracture or focal lesion. Sinuses/Orbits: No acute finding. Other: None. IMPRESSION: Negative head CT.  No intracranial mass, hemorrhage or edema. Electronically Signed   By: Franki Cabot M.D.   On: 12/28/2016 16:18   US Carotid Bilateral  Result Date: 12/29/2016 CLINICAL DATA:  Left-sided weakness for 1 day. History of hypertension, stroke and hyperlipidemia. EXAM: BILATERAL CAROTID DUPLEX ULTRASOUND TECHNIQUE: Pearline Cables scale imaging, color Doppler and duplex ultrasound were performed of bilateral carotid and vertebral arteries in the neck. COMPARISON:  None. FINDINGS: Criteria: Quantification of carotid stenosis is based on velocity parameters that correlate the residual internal carotid diameter with NASCET-based stenosis levels, using the diameter of the distal internal carotid lumen as the denominator for stenosis measurement. The following velocity measurements were obtained: RIGHT ICA:  64/17 cm/sec CCA:  0000000 cm/sec SYSTOLIC ICA/CCA RATIO:  0.6 DIASTOLIC ICA/CCA RATIO:  0.9 ECA:  128 cm/sec LEFT ICA:  54/12 cm/sec CCA:  99991111 cm/sec SYSTOLIC ICA/CCA RATIO:  0.6 DIASTOLIC ICA/CCA RATIO:  0.9 ECA:  129 cm/sec RIGHT CAROTID ARTERY: There is a minimal amount of intimal wall thickening/atherosclerotic plaque within the right carotid bulb (image 16), not resulting in elevated peak systolic velocities within the  interrogated course of the right internal carotid artery to suggest a hemodynamically significant stenosis. RIGHT VERTEBRAL ARTERY:  Antegrade Flow LEFT CAROTID ARTERY: There is a minimal amount of eccentric mixed echogenic plaque within the left carotid bulb (Image 51), not resulting in elevated peak systolic velocities within the interrogated course of the left internal carotid artery to suggest a hemodynamically significant stenosis. LEFT VERTEBRAL ARTERY:  Antegrade Flow IMPRESSION: Minimal amount of bilateral atherosclerotic plaque not resulting in a hemodynamically significant stenosis within either internal carotid artery. Electronically Signed   By: Sandi Mariscal M.D.   On: 12/29/2016 08:23     Assessment/Plan:  61 y.o. female presents with L sided weakness that started yesterday and was transient. Currently resolved an pt has no complaints.  Currently at baseline.  No anti platelet use at home.     - ASA daily - Statin - Awaiting stroke work up such as MRI and MRA - d/c planning  12/29/2016, 1:25 PM

## 2016-12-30 ENCOUNTER — Observation Stay
Admit: 2016-12-30 | Discharge: 2016-12-30 | Disposition: A | Discharge status: Home / Self Care | Payer: Medicare HMO | Attending: Internal Medicine | Admitting: Internal Medicine

## 2016-12-30 LAB — URINE CULTURE: Culture: <10000 — AB

## 2016-12-30 LAB — ECHOCARDIOGRAM COMPLETE
HEIGHTINCHES: 66 in
WEIGHTICAEL: 3456 [oz_av]

## 2016-12-30 NOTE — Progress Notes (Signed)
While rounding, [place] made initial visit to room 101. Pt was in good spirits and energetic. Pt stated that she had been admitted for a stroke, but that all functions had returned. She is hoping for a discharge today. We had a discussion around food and diet. Pt admitted needing some discipline in keeping a heart healthy diet. [place] provided a prayer for continued health and for discipline that would help ensure that.     12/30/16 1400  Clinical Encounter Type  Visited With Patient  Visit Type Initial;Spiritual support  Consult/Referral To Chaplain  Spiritual Encounters  Spiritual Needs Prayer

## 2016-12-30 NOTE — Progress Notes (Signed)
*  PRELIMINARY RESULTS* Echocardiogram 2D Echocardiogram has been performed.  Sherrie Sport 12/30/2016, 1:39 PM

## 2016-12-30 NOTE — Progress Notes (Signed)
pts ride here to pick her up for transport home. Alert no distress. Out via w/c

## 2016-12-30 NOTE — Progress Notes (Signed)
Pt for discharge home. A/o. hih  0  No voiced c/o. Instructions discussed with pt  meds discussed,diet , activity and f/u also discussed. Verbalized understanding of all. Sl d/dc

## 2016-12-30 NOTE — Discharge Summary (Signed)
Selena Walker at [place] NAME: Selena Walker    MR#:  [id number]  DATE OF BIRTH:  07-05-1956  DATE OF ADMISSION:  12/28/2016 ADMITTING PHYSICIAN: Loletha Grayer, MD  DATE OF DISCHARGE: 12/30/2016  PRIMARY CARE PHYSICIAN: Petra Kuba, MD    ADMISSION DIAGNOSIS:  Cough [R05] Left-sided weakness [R53.1] Transient cerebral ischemia, unspecified type [G45.9]  DISCHARGE DIAGNOSIS:  Active Problems:   TIA (transient ischemic attack)   SECONDARY DIAGNOSIS:   Past Medical History:  Diagnosis Date  . Asthma   . GERD (gastroesophageal reflux disease)   . Glaucoma   . Hypertension     HOSPITAL COURSE:   61 year old female with a history of asthma who presents with left-sided weakness.   1. TIA with left-sided weakness which has resolved: Patient underwent MRI which showed no evidence of CVA. Carotid Doppler showed no evidence of hemodynamically significant stenosis. Patient was evaluated by neurology. Telemetry had no acute events recorded. She will be discharged on aspirin and statin  2. Essential hypertension: She will continue outpatient medications  3. Stable meningioma seen on MRI: Patient will have outpatient follow-up with neurology  4. Mild stable intermittent asthma without exacerbation  DISCHARGE CONDITIONS AND DIET:   Patient is stable for discharge on cardiac diet  CONSULTS OBTAINED:  Treatment Team:  Catarina Hartshorn, MD Leotis Pain, MD  DRUG ALLERGIES:   Allergies  Allergen Reactions  . Penicillins   . Ivp Dye [Iodinated Diagnostic Agents] Rash  . Penicillin V Rash  . Shellfish Allergy Rash    DISCHARGE MEDICATIONS:   Current Discharge Medication List    START taking these medications   Details  aspirin EC 81 MG EC tablet Take 1 tablet (81 mg total) by mouth daily.    atorvastatin (LIPITOR) 40 MG tablet Take 1 tablet (40 mg total) by mouth daily at 6 PM. Qty: 30 tablet, Refills: 0       CONTINUE these medications which have NOT CHANGED   Details  albuterol (PROVENTIL HFA;VENTOLIN HFA) 108 (90 Base) MCG/ACT inhaler Inhale 2 puffs into the lungs every 4 (four) hours as needed for wheezing or shortness of breath.    alum & mag hydroxide-simeth (MAALOX/MYLANTA) 200-200-20 MG/5ML suspension Take 5 mLs by mouth every 6 (six) hours as needed for indigestion or heartburn.    chlorthalidone (HYGROTON) 25 MG tablet Take 25 mg by mouth daily.    dorzolamide-timolol (COSOPT) 22.3-6.8 MG/ML ophthalmic solution Place 1 drop into both eyes 2 (two) times daily.    ibuprofen (ADVIL,MOTRIN) 200 MG tablet Take 200 mg by mouth every 6 (six) hours as needed.              Today   CHIEF COMPLAINT:  No acute issues. Her symptoms have resolved   VITAL SIGNS:  Blood pressure 117/61, pulse 75, temperature 98 F (36.7 C), temperature source Oral, resp. rate 20, height 5\' 6"  (1.676 m), weight 98 kg (216 lb), SpO2 100 %.   REVIEW OF SYSTEMS:  Review of Systems  Constitutional: Negative.  Negative for chills, fever and malaise/fatigue.  HENT: Negative.  Negative for ear discharge, ear pain, hearing loss, nosebleeds and sore throat.   Eyes: Negative.  Negative for blurred vision and pain.  Respiratory: Negative.  Negative for cough, hemoptysis, shortness of breath and wheezing.   Cardiovascular: Negative.  Negative for chest pain, palpitations and leg swelling.  Gastrointestinal: Negative.  Negative for abdominal pain, blood in stool, diarrhea, nausea and vomiting.  Genitourinary: Negative.  Negative for dysuria.  Musculoskeletal: Negative.  Negative for back pain.  Skin: Negative.   Neurological: Negative for dizziness, tremors, speech change, focal weakness, seizures and headaches.  Endo/Heme/Allergies: Negative.  Does not bruise/bleed easily.  Psychiatric/Behavioral: Negative.  Negative for depression, hallucinations and suicidal ideas.     PHYSICAL EXAMINATION:  GENERAL:   61 y.o.-year-old patient lying in the bed with no acute distress.  NECK:  Supple, no jugular venous distention. No thyroid enlargement, no tenderness.  LUNGS: Normal breath sounds bilaterally, no wheezing, rales,rhonchi  No use of accessory muscles of respiration.  CARDIOVASCULAR: S1, S2 normal. No murmurs, rubs, or gallops.  ABDOMEN: Soft, non-tender, non-distended. Bowel sounds present. No organomegaly or mass.  EXTREMITIES: No pedal edema, cyanosis, or clubbing.  PSYCHIATRIC: The patient is alert and oriented x 3.  SKIN: No obvious rash, lesion, or ulcer.   DATA REVIEW:   CBC  Recent Labs Lab 12/29/16 0406  WBC 8.4  HGB 11.5*  HCT 35.3  PLT 350    Chemistries   Recent Labs Lab 12/29/16 0406  NA 139  K 3.5  CL 101  CO2 31  GLUCOSE 129*  BUN 14  CREATININE 0.80  CALCIUM 8.9    Cardiac Enzymes No results for input(s): TROPONINI in the last 168 hours.  Microbiology Results  @[email or web address] @  RADIOLOGY:  Dg Chest 2 View  Result Date: 12/28/2016 CLINICAL DATA:  Left-sided weakness. EXAM: CHEST  2 VIEW COMPARISON:  Apr 03, 2016 FINDINGS: The heart size and mediastinal contours are within normal limits. Both lungs are clear. The visualized skeletal structures are unremarkable. IMPRESSION: No active cardiopulmonary disease. Electronically Signed   By: Dorise Bullion III M.D   On: 12/28/2016 18:19   Ct Head Wo Contrast  Result Date: 12/28/2016 CLINICAL DATA:  New left-sided weakness for 3 days with headache. History of prior CVA with right-sided weakness. EXAM: CT HEAD WITHOUT CONTRAST TECHNIQUE: Contiguous axial images were obtained from the base of the skull through the vertex without intravenous contrast. COMPARISON:  CT head dated 08/04/2006 and brain MRI dated 08/03/2012. FINDINGS: Brain: Ventricles are normal in size and configuration. There is mild generalized parenchymal atrophy with commensurate dilatation of the sulci. There is no mass, hemorrhage, edema or other  evidence of acute parenchymal abnormality. No extra-axial hemorrhage. Vascular: No hyperdense vessel or unexpected calcification. Skull: Normal. Negative for fracture or focal lesion. Sinuses/Orbits: No acute finding. Other: None. IMPRESSION: Negative head CT.  No intracranial mass, hemorrhage or edema. Electronically Signed   By: Franki Cabot M.D.   On: 12/28/2016 16:18   Mr Jeri Cos [id number] Contrast  Result Date: 12/29/2016 CLINICAL DATA:  Weakness starting at 9 a.m. this morning. Numbness in the left arm and leg. Symptoms have resolved. EXAM: MRI HEAD WITHOUT AND WITH CONTRAST TECHNIQUE: Multiplanar, multiecho pulse sequences of the brain and surrounding structures were obtained without and with intravenous contrast. CONTRAST:  39mL MULTIHANCE GADOBENATE DIMEGLUMINE 529 MG/ML IV SOLN COMPARISON:  08/03/2012 FINDINGS: Brain: No acute infarction, hemorrhage, hydrocephalus, extra-axial collection or mass lesion. Mild white matter disease for age with predominantly peripheral small FLAIR hyperintensities in the cerebral white matter. No significant progression compared to prior. There is a 6 mm right parafalcine enhancing mass along the lobe posterior tentorium which is T2 isointense and stable from 2013. Vascular: Negative Skull and upper cervical spine: Negative Sinuses/Orbits: Negative IMPRESSION: 1. No acute finding. 2. Mild white matter disease, likely microvascular ischemia, stable from 2013. 3. 6 mm parafalcine meningioma, stable since 2013. Electronically  Signed   By: Monte Fantasia M.D.   On: 12/29/2016 18:06   US Carotid Bilateral  Result Date: 12/29/2016 CLINICAL DATA:  Left-sided weakness for 1 day. History of hypertension, stroke and hyperlipidemia. EXAM: BILATERAL CAROTID DUPLEX ULTRASOUND TECHNIQUE: Pearline Cables scale imaging, color Doppler and duplex ultrasound were performed of bilateral carotid and vertebral arteries in the neck. COMPARISON:  None. FINDINGS: Criteria: Quantification of carotid stenosis is  based on velocity parameters that correlate the residual internal carotid diameter with NASCET-based stenosis levels, using the diameter of the distal internal carotid lumen as the denominator for stenosis measurement. The following velocity measurements were obtained: RIGHT ICA:  64/17 cm/sec CCA:  0000000 cm/sec SYSTOLIC ICA/CCA RATIO:  0.6 DIASTOLIC ICA/CCA RATIO:  0.9 ECA:  128 cm/sec LEFT ICA:  54/12 cm/sec CCA:  99991111 cm/sec SYSTOLIC ICA/CCA RATIO:  0.6 DIASTOLIC ICA/CCA RATIO:  0.9 ECA:  129 cm/sec RIGHT CAROTID ARTERY: There is a minimal amount of intimal wall thickening/atherosclerotic plaque within the right carotid bulb (image 16), not resulting in elevated peak systolic velocities within the interrogated course of the right internal carotid artery to suggest a hemodynamically significant stenosis. RIGHT VERTEBRAL ARTERY:  Antegrade Flow LEFT CAROTID ARTERY: There is a minimal amount of eccentric mixed echogenic plaque within the left carotid bulb (Image 51), not resulting in elevated peak systolic velocities within the interrogated course of the left internal carotid artery to suggest a hemodynamically significant stenosis. LEFT VERTEBRAL ARTERY:  Antegrade Flow IMPRESSION: Minimal amount of bilateral atherosclerotic plaque not resulting in a hemodynamically significant stenosis within either internal carotid artery. Electronically Signed   By: Sandi Mariscal M.D.   On: 12/29/2016 08:23      Management plans discussed with the patient and she is in agreement. Stable for discharge home  Patient should follow up with pcp  CODE STATUS:     Code Status Orders        Start     Ordered   12/28/16 1747  Full code  Continuous     12/28/16 1746    Code Status History    Date Active Date Inactive Code Status Order ID Comments User Context   This patient has a current code status but no historical code status.      TOTAL TIME TAKING CARE OF THIS PATIENT: 36 minutes.    Note: This dictation  was prepared with Dragon dictation along with smaller phrase technology. Any transcriptional errors that result from this process are unintentional.  Audrina Marten M.D on 12/30/2016 at 9:14 AM  Between 7am to 6pm - Pager - [phone] After 6pm go to [email or web address] - password [email or web address]  Office  [phone]  CC: Primary care physician; Petra Kuba, MD

## 2016-12-30 NOTE — Evaluation (Signed)
Occupational Therapy Evaluation Patient Details Name: Selena Walker MRN: [id number] DOB: 30-Jun-1956 Today's Date: 12/30/2016    History of Present Illness 61 year old female pt with a history of asthma, GERD, glaucoma, HTN who presented to hospital on 12/28/16 with left-sided weakness which has since resolved. MRI showed no evidence of CVA, carotid doppler showed no evidence of hemodynamically significant stenosis.    Clinical Impression   Pt seen for OT evaluation this date. Pt was independent with all ADL, IADL, and driving at baseline. Pt presents at baseline with bed mobility, functional mobility and self care tasks. Pt educated in compensatory strategy to maximize safety during LB dressing tasks while seated versus standing on one leg in addition to home mods/falls prevention strategies in the home. Pt does not require additional OT services at this time and pt feels confident in return home. OT will sign off.    Follow Up Recommendations  No OT follow up    Equipment Recommendations  None recommended by OT    Recommendations for Other Services       Precautions / Restrictions        Mobility Bed Mobility Overal bed mobility: Modified Independent             General bed mobility comments: slightly increased time to perform, no physical assist provided  Transfers Overall transfer level: Modified independent Equipment used: None                  Balance Overall balance assessment: No apparent balance deficits (not formally assessed)                                          ADL Overall ADL's : Modified independent                                       General ADL Comments: Pt able to perform bed mobility, ambulating, standing at sink for grooming tasks, and donned/doffed socks while seated EOB modif indep. OT educated pt on compensatory strategy to support safety with LB dressing seated EOB to minimize falls risk, as pt  reports typically standing on 1 leg to put socks on at home     Vision Vision Assessment?: No apparent visual deficits   Perception     Praxis      Pertinent Vitals/Pain Pain Assessment: 0-10 Pain Score: 3  Pain Location: LUE/shoulder Pain Intervention(s): Limited activity within patient's tolerance;Monitored during session     Hand Dominance     Extremity/Trunk Assessment Upper Extremity Assessment Upper Extremity Assessment: Overall WFL for tasks assessed   Lower Extremity Assessment Lower Extremity Assessment: Overall WFL for tasks assessed   Cervical / Trunk Assessment Cervical / Trunk Assessment: Normal   Communication Communication Communication: No difficulties   Cognition Arousal/Alertness: Awake/alert Behavior During Therapy: WFL for tasks assessed/performed Overall Cognitive Status: Within Functional Limits for tasks assessed                     General Comments       Exercises   Other Exercises Other Exercises: Pt educated in falls prevention strategies for home    Shoulder Instructions      Home Living Family/patient expects to be discharged to:: Private residence Living Arrangements: Alone Available Help at Discharge: Family;Available 24  hours/day Type of Home: Mobile home Home Access: Stairs to enter Entrance Stairs-Number of Steps: 3 Entrance Stairs-Rails: Can reach both Home Layout: One level     Bathroom Shower/Tub: Tub/shower unit Shower/tub characteristics: Curtain Biochemist, clinical: Standard Bathroom Accessibility: No   Home Equipment: Environmental consultant - 2 wheels;Cane - quad          Prior Functioning/Environment Level of Independence: Independent        Comments: Pt reports slight difficulty at baseline with opening jars (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) but independent with ADL, IADL, medication mgt, meals, gardening        OT Problem List:     OT Treatment/Interventions:      OT Goals(Current goals  can be found in the care plan section)    OT Frequency:     Barriers to D/C:            Co-evaluation              End of Session    Activity Tolerance: Patient tolerated treatment well Patient left: in bed;with call bell/phone within reach   Time: [id number] OT Time Calculation (min): 20 min Charges:  OT General Charges $OT Visit: 1 Procedure OT Evaluation $OT Eval Low Complexity: 1 Procedure OT Treatments $Self Care/Home Management : 8-22 mins G-Codes: OT G-codes **NOT FOR INPATIENT CLASS** Functional Assessment Tool Used: clinical judgment Functional Limitation: Self care Self Care Current Status [id number]): 0 percent impaired, limited or restricted Self Care Goal Status [id number]): 0 percent impaired, limited or restricted Self Care Discharge Status [id number]): 0 percent impaired, limited or restricted  Corky Sox, OTR/L 12/30/2016, 10:17 AM

## 2017-10-06 ENCOUNTER — Encounter: Payer: Self-pay | Admitting: Emergency Medicine

## 2017-10-06 ENCOUNTER — Emergency Department: Payer: MEDICARE

## 2017-10-06 ENCOUNTER — Other Ambulatory Visit: Payer: Self-pay

## 2017-10-06 ENCOUNTER — Inpatient Hospital Stay
Admission: EM | Admit: 2017-10-06 | Discharge: 2017-10-08 | DRG: 312 | Disposition: A | Discharge status: Home / Self Care | Payer: MEDICARE | Attending: Internal Medicine | Admitting: Internal Medicine

## 2017-10-06 DIAGNOSIS — J45909 Unspecified asthma, uncomplicated: Secondary | ICD-10-CM | POA: Diagnosis not present

## 2017-10-06 DIAGNOSIS — I10 Essential (primary) hypertension: Secondary | ICD-10-CM | POA: Diagnosis present

## 2017-10-06 DIAGNOSIS — R55 Syncope and collapse: Principal | ICD-10-CM | POA: Diagnosis present

## 2017-10-06 DIAGNOSIS — Z8249 Family history of ischemic heart disease and other diseases of the circulatory system: Secondary | ICD-10-CM | POA: Diagnosis not present

## 2017-10-06 DIAGNOSIS — E876 Hypokalemia: Secondary | ICD-10-CM | POA: Diagnosis not present

## 2017-10-06 DIAGNOSIS — Z8673 Personal history of transient ischemic attack (TIA), and cerebral infarction without residual deficits: Secondary | ICD-10-CM

## 2017-10-06 DIAGNOSIS — Z7982 Long term (current) use of aspirin: Secondary | ICD-10-CM

## 2017-10-06 DIAGNOSIS — Z91013 Allergy to seafood: Secondary | ICD-10-CM

## 2017-10-06 DIAGNOSIS — Z91041 Radiographic dye allergy status: Secondary | ICD-10-CM

## 2017-10-06 DIAGNOSIS — IMO0002 Reserved for concepts with insufficient information to code with codable children: Secondary | ICD-10-CM

## 2017-10-06 DIAGNOSIS — Z800 Family history of malignant neoplasm of digestive organs: Secondary | ICD-10-CM

## 2017-10-06 DIAGNOSIS — W19XXXA Unspecified fall, initial encounter: Secondary | ICD-10-CM | POA: Diagnosis present

## 2017-10-06 DIAGNOSIS — Z803 Family history of malignant neoplasm of breast: Secondary | ICD-10-CM | POA: Diagnosis not present

## 2017-10-06 DIAGNOSIS — K219 Gastro-esophageal reflux disease without esophagitis: Secondary | ICD-10-CM | POA: Diagnosis present

## 2017-10-06 DIAGNOSIS — E785 Hyperlipidemia, unspecified: Secondary | ICD-10-CM | POA: Diagnosis present

## 2017-10-06 DIAGNOSIS — S2241XA Multiple fractures of ribs, right side, initial encounter for closed fracture: Secondary | ICD-10-CM

## 2017-10-06 DIAGNOSIS — R109 Unspecified abdominal pain: Secondary | ICD-10-CM | POA: Diagnosis not present

## 2017-10-06 DIAGNOSIS — Z880 Allergy status to penicillin: Secondary | ICD-10-CM | POA: Diagnosis not present

## 2017-10-06 DIAGNOSIS — H409 Unspecified glaucoma: Secondary | ICD-10-CM | POA: Diagnosis present

## 2017-10-06 DIAGNOSIS — Z833 Family history of diabetes mellitus: Secondary | ICD-10-CM | POA: Diagnosis not present

## 2017-10-06 DIAGNOSIS — R911 Solitary pulmonary nodule: Secondary | ICD-10-CM | POA: Diagnosis present

## 2017-10-06 DIAGNOSIS — R229 Localized swelling, mass and lump, unspecified: Secondary | ICD-10-CM

## 2017-10-06 LAB — URINALYSIS, COMPLETE (UACMP) WITH MICROSCOPIC
Bacteria, UA: NONE SEEN
Bilirubin Urine: NEGATIVE
GLUCOSE, UA: NEGATIVE mg/dL
Hgb urine dipstick: NEGATIVE
KETONES UR: NEGATIVE mg/dL
LEUKOCYTES UA: NEGATIVE
Nitrite: NEGATIVE
PH: 6 (ref 5.0–8.0)
Protein, ur: NEGATIVE mg/dL
SPECIFIC GRAVITY, URINE: 1.015 (ref 1.005–1.030)

## 2017-10-06 LAB — CBC
HCT: 41.3 % (ref 35.0–47.0)
Hemoglobin: 13.2 g/dL (ref 12.0–16.0)
MCH: 26.3 pg (ref 26.0–34.0)
MCHC: 32 g/dL (ref 32.0–36.0)
MCV: 82.1 fL (ref 80.0–100.0)
PLATELETS: 393 10*3/uL (ref 150–440)
RBC: 5.04 MIL/uL (ref 3.80–5.20)
RDW: 14.2 % (ref 11.5–14.5)
WBC: 16.3 10*3/uL — AB (ref 3.6–11.0)

## 2017-10-06 LAB — BASIC METABOLIC PANEL
Anion gap: 12 (ref 5–15)
BUN: 21 mg/dL — AB (ref 6–20)
CHLORIDE: 100 mmol/L — AB (ref 101–111)
CO2: 25 mmol/L (ref 22–32)
CREATININE: 0.92 mg/dL (ref 0.44–1.00)
Calcium: 9.3 mg/dL (ref 8.9–10.3)
GFR calc Af Amer: >60 mL/min (ref >60)
GFR calc non Af Amer: >60 mL/min (ref >60)
Glucose, Bld: 113 mg/dL — ABNORMAL HIGH (ref 65–99)
Potassium: 2.9 mmol/L — ABNORMAL LOW (ref 3.5–5.1)
SODIUM: 137 mmol/L (ref 135–145)

## 2017-10-06 LAB — TROPONIN I: Troponin I: <0.03 ng/mL (ref <0.03)

## 2017-10-06 MED ORDER — POTASSIUM CHLORIDE 10 MEQ/100ML IV SOLN
10.0000 meq | INTRAVENOUS | Status: AC
  Administered 2017-10-06 – 2017-10-07 (×3): 10 meq via INTRAVENOUS
  Filled 2017-10-06 (×4): qty 100

## 2017-10-06 MED ORDER — SODIUM CHLORIDE 0.9 % IV BOLUS (SEPSIS)
1000.0000 mL | Freq: Once | INTRAVENOUS | Status: AC
  Administered 2017-10-06: 1000 mL via INTRAVENOUS

## 2017-10-06 MED ORDER — DIPHENHYDRAMINE HCL 50 MG/ML IJ SOLN
50.0000 mg | Freq: Once | INTRAMUSCULAR | Status: AC
  Administered 2017-10-06: 50 mg via INTRAVENOUS
  Filled 2017-10-06: qty 1

## 2017-10-06 MED ORDER — MORPHINE SULFATE (PF) 2 MG/ML IV SOLN
2.0000 mg | Freq: Once | INTRAVENOUS | Status: AC
  Administered 2017-10-06: 2 mg via INTRAVENOUS
  Filled 2017-10-06: qty 1

## 2017-10-06 MED ORDER — MORPHINE SULFATE (PF) 2 MG/ML IV SOLN
INTRAVENOUS | Status: AC
  Filled 2017-10-06: qty 1

## 2017-10-06 MED ORDER — HYDROCORTISONE NA SUCCINATE PF 100 MG IJ SOLR
200.0000 mg | Freq: Once | INTRAMUSCULAR | Status: AC
  Administered 2017-10-06: 200 mg via INTRAVENOUS
  Filled 2017-10-06: qty 4

## 2017-10-06 MED ORDER — DIPHENHYDRAMINE HCL 50 MG/ML IJ SOLN
50.0000 mg | Freq: Once | INTRAMUSCULAR | Status: AC
Start: 2017-10-06 — End: 2017-10-06
  Administered 2017-10-06: 50 mg via INTRAVENOUS
  Filled 2017-10-06: qty 1

## 2017-10-06 MED ORDER — IOPAMIDOL (ISOVUE-370) INJECTION 76%
100.0000 mL | Freq: Once | INTRAVENOUS | Status: AC | PRN
  Administered 2017-10-06: 100 mL via INTRAVENOUS

## 2017-10-06 MED ORDER — ONDANSETRON HCL 4 MG/2ML IJ SOLN
4.0000 mg | Freq: Once | INTRAMUSCULAR | Status: AC
  Administered 2017-10-06: 4 mg via INTRAVENOUS
  Filled 2017-10-06: qty 2

## 2017-10-06 MED ORDER — MORPHINE SULFATE (PF) 2 MG/ML IV SOLN
2.0000 mg | Freq: Once | INTRAVENOUS | Status: AC
  Administered 2017-10-06: 2 mg via INTRAVENOUS

## 2017-10-06 NOTE — ED Provider Notes (Signed)
[facility] Emergency Department Provider Note  ____________________________________________   First MD Initiated Contact with Patient 10/06/17 1624     (approximate)  I have reviewed the triage vital signs and the nursing notes.   HISTORY  Chief Complaint Loss of Consciousness   HPI Selena Walker is a 61 y.o. female with a history of hypertension and TIA who is presenting to the emergency department after sudden loss of consciousness.  Says that she was standing on a ladder about 2 feet off the ground when she suddenly went unconscious.  Unknown duration of this episode.  No reports of seizure or convulsive activity.  Patient says that she hit her head as well as her right hip and has not been able to walk since.  She is not complaining of her right flank being painful as well as the right hip and her lumbar spine.  She denies any numbness or weakness the right lower extremity but says that she cannot move the right lower extremity secondary to pain.  Does not report being on any blood thinners.  Says that she has no history of cardiac syncope.  Prior to passing out she denies any palpitations, chest pain or shortness of breath.  Patient's family is also saying that she seems more forgetful since this incident this afternoon which happened at about 1:45 PM.   Past Medical History:  Diagnosis Date  . Asthma   . GERD (gastroesophageal reflux disease)   . Glaucoma   . Hypertension     Patient Active Problem List   Diagnosis Date Noted  . TIA (transient ischemic attack) 12/28/2016    Past Surgical History:  Procedure Laterality Date  . ABDOMINAL HYSTERECTOMY    . BREAST BIOPSY Right    neg  . COLONOSCOPY  2007    Prior to Admission medications   Medication Sig Start Date End Date Taking? Authorizing Provider  albuterol (PROVENTIL HFA;VENTOLIN HFA) 108 (90 Base) MCG/ACT inhaler Inhale 2 puffs into the lungs every 4 (four) hours as needed for wheezing  or shortness of breath.    [provider]  alum & mag hydroxide-simeth (MAALOX/MYLANTA) 200-200-20 MG/5ML suspension Take 5 mLs by mouth every 6 (six) hours as needed for indigestion or heartburn.    [provider]  aspirin EC 81 MG EC tablet Take 1 tablet (81 mg total) by mouth daily. 12/29/16   Hillary Bow, MD  atorvastatin (LIPITOR) 40 MG tablet Take 1 tablet (40 mg total) by mouth daily at 6 PM. 12/29/16   Hillary Bow, MD  chlorthalidone (HYGROTON) 25 MG tablet Take 25 mg by mouth daily.    [provider]  dorzolamide-timolol (COSOPT) 22.3-6.8 MG/ML ophthalmic solution Place 1 drop into both eyes 2 (two) times daily. 04/15/16   [provider]  ibuprofen (ADVIL,MOTRIN) 200 MG tablet Take 200 mg by mouth every 6 (six) hours as needed.    [provider]    Allergies Penicillins; Ivp dye [iodinated diagnostic agents]; Penicillin v; and Shellfish allergy  Family History  Problem Relation Age of Onset  . Breast cancer Sister 77  . CAD Mother   . Diabetes Mother   . Hypertension Mother   . Hypertension Father   . Stomach cancer Father     Social History Social History   Tobacco Use  . Smoking status: Never Smoker  . Smokeless tobacco: Never Used  Substance Use Topics  . Alcohol use: No  . Drug use: No    Review  of Systems  Constitutional: No fever/chills Eyes: No visual changes. ENT: No sore throat. Cardiovascular: Denies chest pain. Respiratory: Denies shortness of breath. Gastrointestinal: No abdominal pain.  No nausea, no vomiting.  No diarrhea.  No constipation. Genitourinary: Negative for dysuria. Musculoskeletal: As above Skin: Negative for rash. Neurological: Negative for headaches, focal weakness or numbness.   ____________________________________________   PHYSICAL EXAM:  VITAL SIGNS: ED Triage Vitals  Enc Vitals Group     BP 10/06/17 1530 (!) 158/84     Pulse Rate 10/06/17 1530 (!) 110     Resp 10/06/17  1530 20     Temp 10/06/17 1530 98.6 F (37 C)     Temp Source 10/06/17 1530 Oral     SpO2 10/06/17 1530 96 %     Weight 10/06/17 1530 220 lb (99.8 kg)     Height 10/06/17 1530 5\' 6"  (1.676 m)     Head Circumference --      Peak Flow --      Pain Score 10/06/17 1539 9     Pain Loc --      Pain Edu? --      Excl. in [place]? --     Constitutional: Alert and oriented. Well appearing and in no acute distress. Eyes: Conjunctivae are normal.  Head: Atraumatic. Nose: No congestion/rhinnorhea. Mouth/Throat: Mucous membranes are moist.  Neck: No stridor.  No tenderness to the midline cervical spine.  No deformity or step-off. Cardiovascular: Tachycardic, regular rhythm. Grossly normal heart sounds.   Respiratory: Normal respiratory effort.  No retractions. Lungs CTAB. Gastrointestinal: Soft and nontender. No distention.  Musculoskeletal:   Tenderness to palpation of the midline lumbar spine from the proximal lumbar spine through to the sacrum without any deformity or step-off.  Also with right CVA tenderness to palpation.  Also with tenderness to palpation over the right ilium and the posterior hip region.  Patient able to range the right knee as well as the toes in the right ankle.  Patient with an intact right dorsalis pedis pulse and sensation is intact to the entirety of the right lower extremity.  5 out of 5 strength of the left lower extremity.  No deformities.  No leg shortening.  Neurologic:  Normal speech and language. No gross focal neurologic deficits are appreciated. Skin:  Skin is warm, dry and intact. No rash noted. Psychiatric: Mood and affect are normal. Speech and behavior are normal.  ____________________________________________   LABS (all labs ordered are listed, but only abnormal results are displayed)  Labs Reviewed  BASIC METABOLIC PANEL - Abnormal; Notable for the following components:      Result Value   Potassium 2.9 (*)    Chloride 100 (*)    Glucose, Bld 113  (*)    BUN 21 (*)    All other components within normal limits  CBC - Abnormal; Notable for the following components:   WBC 16.3 (*)    All other components within normal limits  URINALYSIS, COMPLETE (UACMP) WITH MICROSCOPIC - Abnormal; Notable for the following components:   Color, Urine YELLOW (*)    APPearance CLEAR (*)    Squamous Epithelial / LPF 0-5 (*)    All other components within normal limits  TROPONIN I   ____________________________________________  EKG  ED ECG REPORT I, Doran Stabler, the attending physician, personally viewed and interpreted this ECG.   Date: 10/06/2017  EKG Time: 1533  Rate: 109  Rhythm: sinus tachycardia  Axis: normal  Intervals:none  ST&T  Change: No ST segment elevation or depression.  No abnormal T wave inversion.  ____________________________________________  RADIOLOGY   No acute finding on the head CT, right hip nor lumbar films.  Pending IV dye prep for contrast scans of the chest and abdomen and pelvis. ____________________________________________   PROCEDURES  Procedure(s) performed:   Procedures  Critical Care performed:   ____________________________________________   INITIAL IMPRESSION / ASSESSMENT AND PLAN / ED COURSE  Pertinent labs & imaging results that were available during my care of the patient were reviewed by me and considered in my medical decision making (see chart for details).  DDX: Arrhythmia, syncope, seizure, lumbar spine fracture, pelvic fracture, hip fracture, intracranial hemorrhage, skull fracture  As part of my medical decision making, I reviewed the following data within the [facility] from most recent admission     ----------------------------------------- 8:34 PM on 10/06/2017 -----------------------------------------  Patient's x-ray so far reassuring.  However, she has outstanding CT angiography of her chest as well as CAT scan of the abdomen and pelvis.   Plan to ultimately admit the patient for syncope versus seizure without prodrome for further workup.  Patient and family aware.  Signed out to Dr. Alfred Levins.    ____________________________________________   FINAL CLINICAL IMPRESSION(S) / ED DIAGNOSES  Syncope.  Tachycardia.  Right flank pain.    NEW MEDICATIONS STARTED DURING THIS VISIT:  This SmartLink is deprecated. Use AVSMEDLIST instead to display the medication list for a patient.   Note:  This document was prepared using Dragon voice recognition software and may include unintentional dictation errors.     Orbie Pyo, MD 10/06/17 2035

## 2017-10-06 NOTE — ED Provider Notes (Signed)
----------------------------------------- 11:27 PM on 10/06/2017 -----------------------------------------   Blood pressure (!) 155/76, pulse 100, temperature 98.6 F (37 C), temperature source Oral, resp. rate 18, height 5\' 6"  (1.676 m), weight 99.8 kg (220 lb), SpO2 91 %.  Assuming care from Dr. Clearnce Hasten of Selena Walker is a 61 y.o. female with a chief complaint of Loss of Consciousness .    Please refer to H&P by previous MD for further details.  The current plan of care is to f/u results of CT to rule out traumatic injuries and admit for syncope workup.  CT Angio Chest PE W and/or Wo Contrast (Final result)  Result time 10/06/17 23:15:43  Final result by Madie Reno, MD (10/06/17 23:15:43)           Narrative:   CLINICAL DATA: Syncopal episode low back pain and shoulder pain  EXAM: CT ANGIOGRAPHY CHEST  CT ABDOMEN AND PELVIS WITH CONTRAST  TECHNIQUE: Multidetector CT imaging of the chest was performed using the standard protocol during bolus administration of intravenous contrast. Multiplanar CT image reconstructions and MIPs were obtained to evaluate the vascular anatomy. Multidetector CT imaging of the abdomen and pelvis was performed using the standard protocol during bolus administration of intravenous contrast.  CONTRAST: 177mL ISOVUE-370 IOPAMIDOL (ISOVUE-370) INJECTION 76%  COMPARISON: 10/06/2017 radiograph, CT 09/08/2015  FINDINGS: CTA CHEST FINDINGS  Cardiovascular: Satisfactory opacification of the pulmonary arteries to the segmental level. No evidence of pulmonary embolism. Normal heart size. No pericardial effusion. Mild atherosclerotic calcification of the aorta. No aneurysmal dilatation. No dissection is seen.  Mediastinum/Nodes: Midline trachea. No thyroid mass. No significant adenopathy. Esophagus within normal limits  Lungs/Pleura: 8 mm ground-glass nodule in the apex of the right upper lobe, series 10, image number 18. No pleural  effusion. No focal consolidation. No pneumothorax.  Musculoskeletal: Degenerative changes of the spine. No suspicious lesion.  Within the retroareolar left breast, there is a 2.5 cm lobulated mass  Review of the MIP images confirms the above findings.  CT ABDOMEN and PELVIS FINDINGS  Hepatobiliary: No focal liver abnormality is seen. No gallstones, gallbladder wall thickening, or biliary dilatation.  Pancreas: Unremarkable. No pancreatic ductal dilatation or surrounding inflammatory changes.  Spleen: Normal in size without focal abnormality.  Adrenals/Urinary Tract: Adrenal glands are unremarkable. Kidneys are normal, without renal calculi, focal lesion, or hydronephrosis. Bladder is unremarkable.  Stomach/Bowel: Stomach is within normal limits. Appendix appears normal. No evidence of bowel wall thickening, distention, or inflammatory changes.  Vascular/Lymphatic: Aortic atherosclerosis. No enlarged abdominal or pelvic lymph nodes.  Reproductive: Status post hysterectomy. No adnexal masses.  Other: Negative for free air or free fluid. Tiny fat in the umbilicus  Musculoskeletal: Acute right eighth, ninth and tenth posterior rib fracture. Vertebral body heights are maintained.  Review of the MIP images confirms the above findings.  IMPRESSION: 1. Negative for acute pulmonary embolus or aortic dissection. 2. No CT evidence for acute solid organ injury within the abdomen or pelvis. 3. Acute right posterior eighth through ninth rib fractures. No pneumothorax. 4. 8 mm ground-glass nodule at the apex of the right upper lobe. Initial follow-up with CT at 6-12 months is recommended to confirm persistence. If persistent, repeat CT is recommended every 2 years until 5 years of stability has been established. This recommendation follows the consensus statement: Guidelines for Management of Incidental Pulmonary Nodules Detected on CT Images: From the Fleischner Society 2017;  Radiology 2017; 284:228-243. 5. 2.5 cm left retroareolar breast mass. Recommend correlation with outpatient mammographic imaging and ultrasound.  Electronically Signed By: Donavan Foil M.D. On: 10/06/2017 23:15          CT showing 2 right-sided rib fractures with no pneumothorax. I discussed with patient and her family members the incidental findings of 8 mm nodule at the apex of the right upper lobe and a 2.5 cm left breast mass and recommended mammogram as an outpatient ASAP and repeat CT in 6 months to rule out lung and breast malignancy. Patient understands these recommendations. At this time she is going to be admitted for syncope evaluation.       [name], MD 10/06/17 2329

## 2017-10-06 NOTE — ED Notes (Addendum)
Pt c/o K+ burning - decreased rate to 30ml/hr

## 2017-10-06 NOTE — ED Triage Notes (Addendum)
Pt assisted to restroom.  Noticed pt dragging right leg. Pt c/o numbness to entire leg.  Started after syncope today.  Now reports hx TIA.  No other focal deficits.  Pt has had lower back pain since fall but leg is numb not pain. Only to right side.  Had one episode of bladder loss; pt admits that she did not know she needed to use bathroom not that couldn't get there in time. Syncope at 145 pm

## 2017-10-06 NOTE — ED Notes (Signed)
Called pharmacy to send K+

## 2017-10-06 NOTE — ED Notes (Signed)
Pharmacy emailed to send K+

## 2017-10-06 NOTE — ED Notes (Signed)
Pt reports that she was standing on the ladder and ask one of her friends to tie her shoe and then she lost consciousness - the next thing she remembers is laying on the ground and people calling her name - denies N/V - denies dizziness - denies blurred vision - c/o lower back pain and right shoulder pain

## 2017-10-06 NOTE — ED Notes (Addendum)
Patient transported to CT - K+ paused for testing

## 2017-10-06 NOTE — ED Notes (Signed)
Pt assisted to toilet to void 

## 2017-10-06 NOTE — ED Notes (Signed)
FIRST NURSE NOTE: Pt assisted from car. Able to bear weight on feet. Pt had syncopal episode PTA per family. Pt alert and oriented X4, active, cooperative, pt in NAD. RR even and unlabored, color WNL.

## 2017-10-06 NOTE — ED Notes (Signed)
Pt remains in CT

## 2017-10-06 NOTE — ED Triage Notes (Signed)
Pt had syncopal episode today.  Does not know why passed out, was standing talking to friends.  C/o pain to right scapula from fall and to buttocks.  No pain prior to syncope.  Felt good today until passed out. No fever. VSS. NAD

## 2017-10-06 NOTE — ED Notes (Signed)
Pt is in CT

## 2017-10-07 ENCOUNTER — Encounter: Payer: Self-pay | Admitting: Internal Medicine

## 2017-10-07 ENCOUNTER — Other Ambulatory Visit: Payer: Self-pay

## 2017-10-07 DIAGNOSIS — J45909 Unspecified asthma, uncomplicated: Secondary | ICD-10-CM | POA: Diagnosis present

## 2017-10-07 DIAGNOSIS — H409 Unspecified glaucoma: Secondary | ICD-10-CM | POA: Diagnosis not present

## 2017-10-07 DIAGNOSIS — Z880 Allergy status to penicillin: Secondary | ICD-10-CM | POA: Diagnosis not present

## 2017-10-07 DIAGNOSIS — Z91013 Allergy to seafood: Secondary | ICD-10-CM | POA: Diagnosis not present

## 2017-10-07 DIAGNOSIS — K219 Gastro-esophageal reflux disease without esophagitis: Secondary | ICD-10-CM | POA: Diagnosis present

## 2017-10-07 DIAGNOSIS — E876 Hypokalemia: Secondary | ICD-10-CM | POA: Diagnosis present

## 2017-10-07 DIAGNOSIS — R55 Syncope and collapse: Secondary | ICD-10-CM | POA: Diagnosis present

## 2017-10-07 DIAGNOSIS — Z7982 Long term (current) use of aspirin: Secondary | ICD-10-CM | POA: Diagnosis not present

## 2017-10-07 DIAGNOSIS — Z833 Family history of diabetes mellitus: Secondary | ICD-10-CM | POA: Diagnosis not present

## 2017-10-07 DIAGNOSIS — Z91041 Radiographic dye allergy status: Secondary | ICD-10-CM | POA: Diagnosis not present

## 2017-10-07 DIAGNOSIS — Z8673 Personal history of transient ischemic attack (TIA), and cerebral infarction without residual deficits: Secondary | ICD-10-CM | POA: Diagnosis not present

## 2017-10-07 DIAGNOSIS — I10 Essential (primary) hypertension: Secondary | ICD-10-CM | POA: Diagnosis not present

## 2017-10-07 DIAGNOSIS — W19XXXA Unspecified fall, initial encounter: Secondary | ICD-10-CM | POA: Diagnosis not present

## 2017-10-07 DIAGNOSIS — Z800 Family history of malignant neoplasm of digestive organs: Secondary | ICD-10-CM | POA: Diagnosis not present

## 2017-10-07 DIAGNOSIS — S2241XA Multiple fractures of ribs, right side, initial encounter for closed fracture: Secondary | ICD-10-CM | POA: Diagnosis not present

## 2017-10-07 DIAGNOSIS — R109 Unspecified abdominal pain: Secondary | ICD-10-CM | POA: Diagnosis present

## 2017-10-07 DIAGNOSIS — E785 Hyperlipidemia, unspecified: Secondary | ICD-10-CM | POA: Diagnosis present

## 2017-10-07 DIAGNOSIS — Z803 Family history of malignant neoplasm of breast: Secondary | ICD-10-CM | POA: Diagnosis not present

## 2017-10-07 DIAGNOSIS — R911 Solitary pulmonary nodule: Secondary | ICD-10-CM | POA: Diagnosis not present

## 2017-10-07 DIAGNOSIS — Z8249 Family history of ischemic heart disease and other diseases of the circulatory system: Secondary | ICD-10-CM | POA: Diagnosis not present

## 2017-10-07 LAB — BASIC METABOLIC PANEL
Anion gap: 9 (ref 5–15)
BUN: 16 mg/dL (ref 6–20)
CHLORIDE: 101 mmol/L (ref 101–111)
CO2: 27 mmol/L (ref 22–32)
CREATININE: 0.81 mg/dL (ref 0.44–1.00)
Calcium: 8.9 mg/dL (ref 8.9–10.3)
GFR calc Af Amer: >60 mL/min (ref >60)
GFR calc non Af Amer: >60 mL/min (ref >60)
GLUCOSE: 163 mg/dL — AB (ref 65–99)
POTASSIUM: 3.3 mmol/L — AB (ref 3.5–5.1)
SODIUM: 137 mmol/L (ref 135–145)

## 2017-10-07 LAB — CBC
HCT: 35.6 % (ref 35.0–47.0)
Hemoglobin: 11.5 g/dL — ABNORMAL LOW (ref 12.0–16.0)
MCH: 26.7 pg (ref 26.0–34.0)
MCHC: 32.2 g/dL (ref 32.0–36.0)
MCV: 82.8 fL (ref 80.0–100.0)
PLATELETS: 344 10*3/uL (ref 150–440)
RBC: 4.3 MIL/uL (ref 3.80–5.20)
RDW: 14.4 % (ref 11.5–14.5)
WBC: 12.5 10*3/uL — AB (ref 3.6–11.0)

## 2017-10-07 LAB — CREATININE, SERUM: CREATININE: 0.66 mg/dL (ref 0.44–1.00)

## 2017-10-07 LAB — TROPONIN I
Troponin I: <0.03 ng/mL (ref <0.03)
Troponin I: <0.03 ng/mL (ref <0.03)

## 2017-10-07 LAB — GLUCOSE, CAPILLARY: Glucose-Capillary: 129 mg/dL — ABNORMAL HIGH (ref 65–99)

## 2017-10-07 MED ORDER — POTASSIUM CHLORIDE CRYS ER 20 MEQ PO TBCR
40.0000 meq | EXTENDED_RELEASE_TABLET | Freq: Once | ORAL | Status: AC
  Administered 2017-10-07: 40 meq via ORAL
  Filled 2017-10-07: qty 2

## 2017-10-07 MED ORDER — SENNOSIDES-DOCUSATE SODIUM 8.6-50 MG PO TABS: 1.0000 | ORAL_TABLET | Freq: Every evening | ORAL | Status: DC | PRN

## 2017-10-07 MED ORDER — ENOXAPARIN SODIUM 40 MG/0.4ML ~~LOC~~ SOLN
40.0000 mg | SUBCUTANEOUS | Status: DC
  Administered 2017-10-07: 40 mg via SUBCUTANEOUS
  Filled 2017-10-07: qty 0.4

## 2017-10-07 MED ORDER — ASPIRIN EC 81 MG PO TBEC
81.0000 mg | DELAYED_RELEASE_TABLET | Freq: Every day | ORAL | Status: DC
  Administered 2017-10-07 – 2017-10-08 (×2): 81 mg via ORAL
  Filled 2017-10-07 (×2): qty 1

## 2017-10-07 MED ORDER — ALUM & MAG HYDROXIDE-SIMETH 200-200-20 MG/5ML PO SUSP: 5.0000 mL | Freq: Four times a day (QID) | ORAL | Status: DC | PRN

## 2017-10-07 MED ORDER — ONDANSETRON HCL 4 MG PO TABS
4.0000 mg | ORAL_TABLET | Freq: Four times a day (QID) | ORAL | Status: DC | PRN
  Administered 2017-10-08: 4 mg via ORAL
  Filled 2017-10-07: qty 1

## 2017-10-07 MED ORDER — ACETAMINOPHEN 650 MG RE SUPP: 650.0000 mg | Freq: Four times a day (QID) | RECTAL | Status: DC | PRN

## 2017-10-07 MED ORDER — LORATADINE 10 MG PO TABS
10.0000 mg | ORAL_TABLET | Freq: Every day | ORAL | Status: DC
  Administered 2017-10-07 – 2017-10-08 (×2): 10 mg via ORAL
  Filled 2017-10-07 (×2): qty 1

## 2017-10-07 MED ORDER — ATORVASTATIN CALCIUM 20 MG PO TABS
40.0000 mg | ORAL_TABLET | Freq: Every day | ORAL | Status: DC
  Administered 2017-10-07: 40 mg via ORAL
  Filled 2017-10-07: qty 2

## 2017-10-07 MED ORDER — ACETAMINOPHEN 325 MG PO TABS: 650.0000 mg | ORAL_TABLET | Freq: Four times a day (QID) | ORAL | Status: DC | PRN

## 2017-10-07 MED ORDER — ONDANSETRON HCL 4 MG/2ML IJ SOLN: 4.0000 mg | Freq: Four times a day (QID) | INTRAMUSCULAR | Status: DC | PRN

## 2017-10-07 MED ORDER — MORPHINE SULFATE (PF) 2 MG/ML IV SOLN: 2.0000 mg | INTRAVENOUS | Status: DC | PRN

## 2017-10-07 MED ORDER — DORZOLAMIDE HCL-TIMOLOL MAL 2-0.5 % OP SOLN
1.0000 [drp] | Freq: Two times a day (BID) | OPHTHALMIC | Status: DC
  Administered 2017-10-07 – 2017-10-08 (×3): 1 [drp] via OPHTHALMIC
  Filled 2017-10-07: qty 10

## 2017-10-07 MED ORDER — PNEUMOCOCCAL VAC POLYVALENT 25 MCG/0.5ML IJ INJ: 0.5000 mL | INJECTION | INTRAMUSCULAR | Status: DC

## 2017-10-07 MED ORDER — MAGNESIUM SULFATE 2 GM/50ML IV SOLN
2.0000 g | Freq: Once | INTRAVENOUS | Status: AC
  Administered 2017-10-07: 2 g via INTRAVENOUS
  Filled 2017-10-07: qty 50

## 2017-10-07 MED ORDER — ALBUTEROL SULFATE (2.5 MG/3ML) 0.083% IN NEBU: 3.0000 mL | INHALATION_SOLUTION | RESPIRATORY_TRACT | Status: DC | PRN

## 2017-10-07 MED ORDER — SODIUM CHLORIDE 0.9% FLUSH
3.0000 mL | Freq: Two times a day (BID) | INTRAVENOUS | Status: DC
  Administered 2017-10-08: 3 mL via INTRAVENOUS

## 2017-10-07 MED ORDER — HYDROCODONE-ACETAMINOPHEN 5-325 MG PO TABS
1.0000 | ORAL_TABLET | ORAL | Status: DC | PRN
  Administered 2017-10-07 – 2017-10-08 (×6): 1 via ORAL
  Filled 2017-10-07 (×6): qty 1

## 2017-10-07 MED ORDER — SODIUM CHLORIDE 0.9 % IV SOLN
INTRAVENOUS | Status: DC
  Administered 2017-10-07 (×2): via INTRAVENOUS

## 2017-10-07 NOTE — Care Management Note (Addendum)
Case Management Note  Patient Details  Name: Selena Walker MRN: [id number] Date of Birth: [birth year]  Subjective/Objective:                 Admitted due to syncopal episode which resulted in loss of consciousness and fall.  Patient sustained 8-9 rib fractures on the right side.  currently on room air.  There is on 02 sat of 91 at rest on room air.  Discussed obtaining exertional room air sat prior to discharge during progression. Informed "patient moving very slowly.  Prior to this admission, patient Independent in all adls, no issues accessing medical care, obtaining medications or with transportation.  Current with her PCP.     Action/Plan:  Requested order for physical therapy  Expected Discharge Date:                  Expected Discharge Plan:     In-House Referral:     Discharge planning Services     Post Acute Care Choice:    Choice offered to:     DME Arranged:    DME Agency:     HH Arranged:    HH Agency:     Status of Service:     If discussed at [employer] of Stay Meetings, dates discussed:    Additional Comments:  Katrina Stack, RN 10/07/2017, 9:06 AM

## 2017-10-07 NOTE — Progress Notes (Signed)
Patient ID: Selena Walker, female   DOB: 1956/04/19, 61 y.o.   MRN: [id number]  [facility] PROGRESS NOTE  Selena Walker DOB: February 12, 1956 DOA: 10/06/2017 PCP: Petra Kuba, MD  HPI/Subjective: Patient was up on a ladder putting decorations up.  She noticed that her shoe was untied.  She asked someone to tie her shoe and the next thing she knows she was being woken up on the ground.  No seizure activity witnessed.  She did lose her urine when she ended up on the ground.  She complains of a lot of pain in her ribs.  She feels her right leg is a little weaker because she fell on it.  Objective: Vitals:   10/07/17 0833 10/07/17 1506  BP: (!) 135/55 108/69  Pulse: (!) 106 90  Resp: 16 15  Temp: 98.1 F (36.7 C) 98.4 F (36.9 C)  SpO2: 91% 99%    Filed Weights   10/06/17 1530 10/07/17 0430  Weight: 99.8 kg (220 lb) 95.3 kg (210 lb)    ROS: Review of Systems  Constitutional: Negative for chills and fever.  Eyes: Negative for blurred vision.  Respiratory: Negative for cough and shortness of breath.   Cardiovascular: Negative for chest pain.  Gastrointestinal: Negative for abdominal pain, constipation, diarrhea, nausea and vomiting.  Genitourinary: Negative for dysuria.  Musculoskeletal: Negative for joint pain.  Neurological: Negative for dizziness and headaches.   Exam: Physical Exam  Constitutional: She is oriented to person, place, and time.  HENT:  Nose: No mucosal edema.  Mouth/Throat: No oropharyngeal exudate or posterior oropharyngeal edema.  Eyes: Conjunctivae, EOM and lids are normal. Pupils are equal, round, and reactive to light.  Neck: No JVD present. Carotid bruit is not present. No edema present. No thyroid mass and no thyromegaly present.  Cardiovascular: S1 normal and S2 normal. Exam reveals no gallop.  No murmur heard. Pulses:      Dorsalis pedis pulses are 2+ on the right side, and 2+ on the left side.  Respiratory: No respiratory  distress. She has no wheezes. She has no rhonchi. She has no rales.  GI: Soft. Bowel sounds are normal. There is no tenderness.  Musculoskeletal:       Right ankle: She exhibits no swelling.       Left ankle: She exhibits no swelling.  Lymphadenopathy:    She has no cervical adenopathy.  Neurological: She is alert and oriented to person, place, and time. No cranial nerve deficit.  Skin: Skin is warm. No rash noted. Nails show no clubbing.  Psychiatric: She has a normal mood and affect.      Data Reviewed: Basic Metabolic Panel: Recent Labs  Lab 10/06/17 1542 10/07/17 0638  NA 137 137  K 2.9* 3.3*  CL 100* 101  CO2 25 27  GLUCOSE 113* 163*  BUN 21* 16  CREATININE 0.92 0.66  0.81  CALCIUM 9.3 8.9   CBC: Recent Labs  Lab 10/06/17 1542 10/07/17 0638  WBC 16.3* 12.5*  HGB 13.2 11.5*  HCT 41.3 35.6  MCV 82.1 82.8  PLT 393 344   Cardiac Enzymes: Recent Labs  Lab 10/06/17 1542 10/07/17 0638 10/07/17 1036  TROPONINI <0.03 <0.03 <0.03    CBG: Recent Labs  Lab 10/07/17 0750  GLUCAP 129*      Studies: Dg Lumbar Spine Complete  Result Date: 10/06/2017 CLINICAL DATA:  Syncope with back pain EXAM: LUMBAR SPINE - COMPLETE 4+ VIEW COMPARISON:  08/29/2015 FINDINGS: Lumbar alignment within normal limits.  Vertebral body heights are maintained. Moderate degenerative changes at L5-S1. Mild degenerative changes at L1-L2 and L2-L3. IMPRESSION: Degenerative changes.  No acute osseous abnormality. Electronically Signed   By: Donavan Foil M.D.   On: 10/06/2017 16:59   Ct Head Wo Contrast  Result Date: 10/06/2017 CLINICAL DATA:  Syncope.  Fell and hit head today EXAM: CT HEAD WITHOUT CONTRAST TECHNIQUE: Contiguous axial images were obtained from the base of the skull through the vertex without intravenous contrast. COMPARISON:  CT head 12/28/2016 FINDINGS: Brain: No evidence of acute infarction, hemorrhage, hydrocephalus, extra-axial collection or mass lesion/mass effect.  Vascular: Negative for hyperdense vessel Skull: Negative for skull fracture Sinuses/Orbits: Negative Other: None IMPRESSION: Negative CT head Electronically Signed   By: Franchot Gallo M.D.   On: 10/06/2017 16:34   Ct Angio Chest Pe W And/or Wo Contrast  Result Date: 10/06/2017 CLINICAL DATA:  Syncopal episode low back pain and shoulder pain EXAM: CT ANGIOGRAPHY CHEST CT ABDOMEN AND PELVIS WITH CONTRAST TECHNIQUE: Multidetector CT imaging of the chest was performed using the standard protocol during bolus administration of intravenous contrast. Multiplanar CT image reconstructions and MIPs were obtained to evaluate the vascular anatomy. Multidetector CT imaging of the abdomen and pelvis was performed using the standard protocol during bolus administration of intravenous contrast. CONTRAST:  133mL ISOVUE-370 IOPAMIDOL (ISOVUE-370) INJECTION 76% COMPARISON:  10/06/2017 radiograph, CT 09/08/2015 FINDINGS: CTA CHEST FINDINGS Cardiovascular: Satisfactory opacification of the pulmonary arteries to the segmental level. No evidence of pulmonary embolism. Normal heart size. No pericardial effusion. Mild atherosclerotic calcification of the aorta. No aneurysmal dilatation. No dissection is seen. Mediastinum/Nodes: Midline trachea. No thyroid mass. No significant adenopathy. Esophagus within normal limits Lungs/Pleura: 8 mm ground-glass nodule in the apex of the right upper lobe, series 10, image number 18. No pleural effusion. No focal consolidation. No pneumothorax. Musculoskeletal: Degenerative changes of the spine. No suspicious lesion. Within the retroareolar left breast, there is a 2.5 cm lobulated mass Review of the MIP images confirms the above findings. CT ABDOMEN and PELVIS FINDINGS Hepatobiliary: No focal liver abnormality is seen. No gallstones, gallbladder wall thickening, or biliary dilatation. Pancreas: Unremarkable. No pancreatic ductal dilatation or surrounding inflammatory changes. Spleen: Normal in size  without focal abnormality. Adrenals/Urinary Tract: Adrenal glands are unremarkable. Kidneys are normal, without renal calculi, focal lesion, or hydronephrosis. Bladder is unremarkable. Stomach/Bowel: Stomach is within normal limits. Appendix appears normal. No evidence of bowel wall thickening, distention, or inflammatory changes. Vascular/Lymphatic: Aortic atherosclerosis. No enlarged abdominal or pelvic lymph nodes. Reproductive: Status post hysterectomy. No adnexal masses. Other: Negative for free air or free fluid. Tiny fat in the umbilicus Musculoskeletal: Acute right eighth, ninth and tenth posterior rib fracture. Vertebral body heights are maintained. Review of the MIP images confirms the above findings. IMPRESSION: 1. Negative for acute pulmonary embolus or aortic dissection. 2. No CT evidence for acute solid organ injury within the abdomen or pelvis. 3. Acute right posterior eighth through ninth rib fractures. No pneumothorax. 4. 8 mm ground-glass nodule at the apex of the right upper lobe. Initial follow-up with CT at 6-12 months is recommended to confirm persistence. If persistent, repeat CT is recommended every 2 years until 5 years of stability has been established. This recommendation follows the consensus statement: Guidelines for Management of Incidental Pulmonary Nodules Detected on CT Images: From the Fleischner Society 2017; Radiology 2017; 284:228-243. 5. 2.5 cm left retroareolar breast mass. Recommend correlation with outpatient mammographic imaging and ultrasound. Electronically Signed   By: Madie Reno.D.  On: 10/06/2017 23:15   Ct Abdomen Pelvis W Contrast  Result Date: 10/06/2017 CLINICAL DATA:  Syncopal episode low back pain and shoulder pain EXAM: CT ANGIOGRAPHY CHEST CT ABDOMEN AND PELVIS WITH CONTRAST TECHNIQUE: Multidetector CT imaging of the chest was performed using the standard protocol during bolus administration of intravenous contrast. Multiplanar CT image  reconstructions and MIPs were obtained to evaluate the vascular anatomy. Multidetector CT imaging of the abdomen and pelvis was performed using the standard protocol during bolus administration of intravenous contrast. CONTRAST:  146mL ISOVUE-370 IOPAMIDOL (ISOVUE-370) INJECTION 76% COMPARISON:  10/06/2017 radiograph, CT 09/08/2015 FINDINGS: CTA CHEST FINDINGS Cardiovascular: Satisfactory opacification of the pulmonary arteries to the segmental level. No evidence of pulmonary embolism. Normal heart size. No pericardial effusion. Mild atherosclerotic calcification of the aorta. No aneurysmal dilatation. No dissection is seen. Mediastinum/Nodes: Midline trachea. No thyroid mass. No significant adenopathy. Esophagus within normal limits Lungs/Pleura: 8 mm ground-glass nodule in the apex of the right upper lobe, series 10, image number 18. No pleural effusion. No focal consolidation. No pneumothorax. Musculoskeletal: Degenerative changes of the spine. No suspicious lesion. Within the retroareolar left breast, there is a 2.5 cm lobulated mass Review of the MIP images confirms the above findings. CT ABDOMEN and PELVIS FINDINGS Hepatobiliary: No focal liver abnormality is seen. No gallstones, gallbladder wall thickening, or biliary dilatation. Pancreas: Unremarkable. No pancreatic ductal dilatation or surrounding inflammatory changes. Spleen: Normal in size without focal abnormality. Adrenals/Urinary Tract: Adrenal glands are unremarkable. Kidneys are normal, without renal calculi, focal lesion, or hydronephrosis. Bladder is unremarkable. Stomach/Bowel: Stomach is within normal limits. Appendix appears normal. No evidence of bowel wall thickening, distention, or inflammatory changes. Vascular/Lymphatic: Aortic atherosclerosis. No enlarged abdominal or pelvic lymph nodes. Reproductive: Status post hysterectomy. No adnexal masses. Other: Negative for free air or free fluid. Tiny fat in the umbilicus Musculoskeletal: Acute  right eighth, ninth and tenth posterior rib fracture. Vertebral body heights are maintained. Review of the MIP images confirms the above findings. IMPRESSION: 1. Negative for acute pulmonary embolus or aortic dissection. 2. No CT evidence for acute solid organ injury within the abdomen or pelvis. 3. Acute right posterior eighth through ninth rib fractures. No pneumothorax. 4. 8 mm ground-glass nodule at the apex of the right upper lobe. Initial follow-up with CT at 6-12 months is recommended to confirm persistence. If persistent, repeat CT is recommended every 2 years until 5 years of stability has been established. This recommendation follows the consensus statement: Guidelines for Management of Incidental Pulmonary Nodules Detected on CT Images: From the Fleischner Society 2017; Radiology 2017; 284:228-243. 5. 2.5 cm left retroareolar breast mass. Recommend correlation with outpatient mammographic imaging and ultrasound. Electronically Signed   By: Donavan Foil M.D.   On: 10/06/2017 23:15   Dg Hip Unilat W Or Wo Pelvis 2-3 Views Right  Result Date: 10/06/2017 CLINICAL DATA:  61 year old female with fall and right hip pain. EXAM: DG HIP (WITH OR WITHOUT PELVIS) 2-3V RIGHT COMPARISON:  None. FINDINGS: There is no acute fracture or dislocation. Minimal arthritic changes. The soft tissues are grossly unremarkable. IMPRESSION: Negative. Electronically Signed   By: Anner Crete M.D.   On: 10/06/2017 18:51    Scheduled Meds: . aspirin EC  81 mg Oral Daily  . atorvastatin  40 mg Oral q1800  . dorzolamide-timolol  1 drop Both Eyes BID  . enoxaparin (LOVENOX) injection  40 mg Subcutaneous Q24H  . loratadine  10 mg Oral Daily  . [START ON 10/08/2017] pneumococcal 23 valent vaccine  0.5 mL Intramuscular Tomorrow-1000  . sodium chloride flush  3 mL Intravenous Q12H   Continuous Infusions: . sodium chloride 75 mL/hr at 10/07/17 0437  . magnesium sulfate 1 - 4 g bolus IVPB 2 g (10/07/17 1555)     Assessment/Plan:  1. Syncope.  Unclear etiology.  Monitor on telemetry.  Echocardiogram ordered.  Could be that she was up and down on the lateral looking up and down. 2. Hypokalemia.  Replace magnesium and potassium.  Check magnesium and potassium again tomorrow.  Stop I [place] which is not a great medication for this patient. 3. Right-sided rib fracture and pain.  As needed pain medication 4. Lung nodule seen on CT scan will need repeat CT scan in 6-12 months. 5. Breast mass seen on CT scan.  Mammogram negative within the year.  I try to order an ultrasound of the breast but they stated it must be done as outpatient. 6. Hyperlipidemia unspecified on atorvastatin 7. Glaucoma continue medications  Code Status:     Code Status Orders  (From admission, onward)        Start     Ordered   10/07/17 0412  Full code  Continuous     10/07/17 0411    Code Status History    Date Active Date Inactive Code Status Order ID Comments User Context   12/28/2016 17:46 12/30/2016 20:06 Full Code [id number]  Loletha Grayer, MD ED     Family Communication: Family at the bedside Disposition Plan: Potential disposition tomorrow  Time spent: 43 minutes  Loletha Grayer  [employer]

## 2017-10-07 NOTE — ED Notes (Signed)
Pt transport to 234

## 2017-10-07 NOTE — Plan of Care (Signed)
Remains free from infection.  Slow to move due to fall at home, prn pain medications for complaints of pain.  Fall precautions in place, non skid socks when oob

## 2017-10-07 NOTE — Progress Notes (Signed)
Patient arrived to 2A Room 234. Patient denies pain and all questions answered. Patient oriented to unit and use of room phone/call bell. 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 A&Ox4, VSS, and NSR/ST on verified tele-box #[id number]. Nursing staff will continue to monitor for any changes in patient status. Earleen Reaper, RN

## 2017-10-07 NOTE — H&P (Signed)
[facility] NAME: Selena Walker    MR#:  [id number]  DATE OF BIRTH:  1956/01/03  DATE OF ADMISSION:  10/06/2017  PRIMARY CARE PHYSICIAN: Petra Kuba, MD   REQUESTING/REFERRING PHYSICIAN:   CHIEF COMPLAINT:   Chief Complaint  Patient presents with  . Loss of Consciousness    HISTORY OF PRESENT ILLNESS: Selena Walker  is a 61 y.o. female with a known history of bronchial asthma, GERD, glaucoma, hypertension presented to the emergency room after she passed out at her friend's house. Patient was standing and suddenly she passed out. She landed on her right side of the chest she has some pain when she takes a deep breath. She was evaluated in the emergency room with CT of the chest showed no pulmonary embolism but revealed rib fractures secondary to fall. Her potassium was low and first set of troponin negative. No complaints of any shortness of breath. No fever and chills. She was also worked up with CT head which showed no acute abnormality. Hospitalist service was consulted for further care.  PAST MEDICAL HISTORY:   Past Medical History:  Diagnosis Date  . Asthma   . GERD (gastroesophageal reflux disease)   . Glaucoma   . Hypertension     PAST SURGICAL HISTORY:  Past Surgical History:  Procedure Laterality Date  . ABDOMINAL HYSTERECTOMY    . BREAST BIOPSY Right    neg  . COLONOSCOPY  2007    SOCIAL HISTORY:  Social History   Tobacco Use  . Smoking status: Never Smoker  . Smokeless tobacco: Never Used  Substance Use Topics  . Alcohol use: No    FAMILY HISTORY:  Family History  Problem Relation Age of Onset  . Breast cancer Sister 57  . CAD Mother   . Diabetes Mother   . Hypertension Mother   . Hypertension Father   . Stomach cancer Father     DRUG ALLERGIES:  Allergies  Allergen Reactions  . Penicillins   . Ivp Dye [Iodinated Diagnostic Agents] Rash  . Penicillin V Rash  . Shellfish Allergy  Rash    REVIEW OF SYSTEMS:   CONSTITUTIONAL: No fever, fatigue or weakness.  EYES: No blurred or double vision.  EARS, NOSE, AND THROAT: No tinnitus or ear pain.  RESPIRATORY: No cough, shortness of breath, wheezing or hemoptysis.  CARDIOVASCULAR: Has chest pain on deep breathing,  No orthopnea, edema.  GASTROINTESTINAL: No nausea, vomiting, diarrhea or abdominal pain.  GENITOURINARY: No dysuria, hematuria.  ENDOCRINE: No polyuria, nocturia,  HEMATOLOGY: No anemia, easy bruising or bleeding SKIN: No rash or lesion. MUSCULOSKELETAL: No joint pain or arthritis.   NEUROLOGIC: No tingling, numbness, weakness.  PSYCHIATRY: No anxiety or depression.   MEDICATIONS AT HOME:  Prior to Admission medications   Medication Sig Start Date End Date Taking? Authorizing Provider  albuterol (PROVENTIL HFA;VENTOLIN HFA) 108 (90 Base) MCG/ACT inhaler Inhale 2 puffs into the lungs every 4 (four) hours as needed for wheezing or shortness of breath.   Yes [provider]  alum & mag hydroxide-simeth (MAALOX/MYLANTA) 200-200-20 MG/5ML suspension Take 5 mLs by mouth every 6 (six) hours as needed for indigestion or heartburn.   Yes [provider]  aspirin EC 81 MG EC tablet Take 1 tablet (81 mg total) by mouth daily. 12/29/16  Yes Hillary Bow, MD  cetirizine (ZYRTEC) 10 MG tablet Take 10 mg at bedtime by mouth.   Yes [provider]  chlorthalidone (  HYGROTON) 25 MG tablet Take 25 mg by mouth daily.   Yes [provider]  dorzolamide-timolol (COSOPT) 22.3-6.8 MG/ML ophthalmic solution Place 1 drop into both eyes 2 (two) times daily. 04/15/16  Yes [provider]  ibuprofen (ADVIL,MOTRIN) 200 MG tablet Take 200 mg by mouth every 6 (six) hours as needed.   Yes [provider]  atorvastatin (LIPITOR) 40 MG tablet Take 1 tablet (40 mg total) by mouth daily at 6 PM. Patient not taking: Reported on 10/06/2017 12/29/16   Hillary Bow, MD      PHYSICAL  EXAMINATION:   VITAL SIGNS: Blood pressure 131/64, pulse 100, temperature 98.6 F (37 C), temperature source Oral, resp. rate 16, height 5\' 6"  (1.676 m), weight 99.8 kg (220 lb), SpO2 94 %.  GENERAL:  61 y.o.-year-old patient lying in the bed with no acute distress.  EYES: Pupils equal, round, reactive to light and accommodation. No scleral icterus. Extraocular muscles intact.  HEENT: Head atraumatic, normocephalic. Oropharynx and nasopharynx clear.  NECK:  Supple, no jugular venous distention. No thyroid enlargement, no tenderness.  LUNGS: Normal breath sounds bilaterally, no wheezing, rales,rhonchi or crepitation. No use of accessory muscles of respiration.  Tenderness on palpation right chest wall CARDIOVASCULAR: S1, S2 normal. No murmurs, rubs, or gallops.  ABDOMEN: Soft, nontender, nondistended. Bowel sounds present. No organomegaly or mass.  EXTREMITIES: No pedal edema, cyanosis, or clubbing.  NEUROLOGIC: Cranial nerves II through XII are intact. Muscle strength 5/5 in all extremities. Sensation intact. Gait not checked.  PSYCHIATRIC: The patient is alert and oriented x 3.  SKIN: No obvious rash, lesion, or ulcer.   LABORATORY PANEL:   CBC Recent Labs  Lab 10/06/17 1542  WBC 16.3*  HGB 13.2  HCT 41.3  PLT 393  MCV 82.1  MCH 26.3  MCHC 32.0  RDW 14.2   ------------------------------------------------------------------------------------------------------------------  Chemistries  Recent Labs  Lab 10/06/17 1542  NA 137  K 2.9*  CL 100*  CO2 25  GLUCOSE 113*  BUN 21*  CREATININE 0.92  CALCIUM 9.3   ------------------------------------------------------------------------------------------------------------------ estimated creatinine clearance is 74.6 mL/min (by C-G formula based on SCr of 0.92 mg/dL). ------------------------------------------------------------------------------------------------------------------ No results for input(s): TSH, T4TOTAL, T3FREE,  THYROIDAB in the last 72 hours.  Invalid input(s): FREET3   Coagulation profile No results for input(s): INR, PROTIME in the last 168 hours. ------------------------------------------------------------------------------------------------------------------- No results for input(s): DDIMER in the last 72 hours. -------------------------------------------------------------------------------------------------------------------  Cardiac Enzymes Recent Labs  Lab 10/06/17 1542  TROPONINI <0.03   ------------------------------------------------------------------------------------------------------------------ Invalid input(s): POCBNP  ---------------------------------------------------------------------------------------------------------------  Urinalysis    Component Value Date/Time   COLORURINE YELLOW (A) 10/06/2017 1542   APPEARANCEUR CLEAR (A) 10/06/2017 1542   APPEARANCEUR Clear 04/17/2014 1727   LABSPEC 1.015 10/06/2017 1542   LABSPEC 1.006 04/17/2014 1727   PHURINE 6.0 10/06/2017 1542   GLUCOSEU NEGATIVE 10/06/2017 1542   GLUCOSEU Negative 04/17/2014 1727   HGBUR NEGATIVE 10/06/2017 1542   BILIRUBINUR NEGATIVE 10/06/2017 1542   BILIRUBINUR Negative 04/17/2014 1727   KETONESUR NEGATIVE 10/06/2017 1542   PROTEINUR NEGATIVE 10/06/2017 1542   NITRITE NEGATIVE 10/06/2017 1542   LEUKOCYTESUR NEGATIVE 10/06/2017 1542   LEUKOCYTESUR Negative 04/17/2014 1727     RADIOLOGY: Dg Lumbar Spine Complete  Result Date: 10/06/2017 CLINICAL DATA:  Syncope with back pain EXAM: LUMBAR SPINE - COMPLETE 4+ VIEW COMPARISON:  08/29/2015 FINDINGS: Lumbar alignment within normal limits. Vertebral body heights are maintained. Moderate degenerative changes at L5-S1. Mild degenerative changes at L1-L2 and L2-L3. IMPRESSION: Degenerative changes.  No acute osseous  abnormality. Electronically Signed   By: Donavan Foil M.D.   On: 10/06/2017 16:59   Ct Head Wo Contrast  Result Date:  10/06/2017 CLINICAL DATA:  Syncope.  Fell and hit head today EXAM: CT HEAD WITHOUT CONTRAST TECHNIQUE: Contiguous axial images were obtained from the base of the skull through the vertex without intravenous contrast. COMPARISON:  CT head 12/28/2016 FINDINGS: Brain: No evidence of acute infarction, hemorrhage, hydrocephalus, extra-axial collection or mass lesion/mass effect. Vascular: Negative for hyperdense vessel Skull: Negative for skull fracture Sinuses/Orbits: Negative Other: None IMPRESSION: Negative CT head Electronically Signed   By: Franchot Gallo M.D.   On: 10/06/2017 16:34   Ct Angio Chest Pe W And/or Wo Contrast  Result Date: 10/06/2017 CLINICAL DATA:  Syncopal episode low back pain and shoulder pain EXAM: CT ANGIOGRAPHY CHEST CT ABDOMEN AND PELVIS WITH CONTRAST TECHNIQUE: Multidetector CT imaging of the chest was performed using the standard protocol during bolus administration of intravenous contrast. Multiplanar CT image reconstructions and MIPs were obtained to evaluate the vascular anatomy. Multidetector CT imaging of the abdomen and pelvis was performed using the standard protocol during bolus administration of intravenous contrast. CONTRAST:  156mL ISOVUE-370 IOPAMIDOL (ISOVUE-370) INJECTION 76% COMPARISON:  10/06/2017 radiograph, CT 09/08/2015 FINDINGS: CTA CHEST FINDINGS Cardiovascular: Satisfactory opacification of the pulmonary arteries to the segmental level. No evidence of pulmonary embolism. Normal heart size. No pericardial effusion. Mild atherosclerotic calcification of the aorta. No aneurysmal dilatation. No dissection is seen. Mediastinum/Nodes: Midline trachea. No thyroid mass. No significant adenopathy. Esophagus within normal limits Lungs/Pleura: 8 mm ground-glass nodule in the apex of the right upper lobe, series 10, image number 18. No pleural effusion. No focal consolidation. No pneumothorax. Musculoskeletal: Degenerative changes of the spine. No suspicious lesion. Within the  retroareolar left breast, there is a 2.5 cm lobulated mass Review of the MIP images confirms the above findings. CT ABDOMEN and PELVIS FINDINGS Hepatobiliary: No focal liver abnormality is seen. No gallstones, gallbladder wall thickening, or biliary dilatation. Pancreas: Unremarkable. No pancreatic ductal dilatation or surrounding inflammatory changes. Spleen: Normal in size without focal abnormality. Adrenals/Urinary Tract: Adrenal glands are unremarkable. Kidneys are normal, without renal calculi, focal lesion, or hydronephrosis. Bladder is unremarkable. Stomach/Bowel: Stomach is within normal limits. Appendix appears normal. No evidence of bowel wall thickening, distention, or inflammatory changes. Vascular/Lymphatic: Aortic atherosclerosis. No enlarged abdominal or pelvic lymph nodes. Reproductive: Status post hysterectomy. No adnexal masses. Other: Negative for free air or free fluid. Tiny fat in the umbilicus Musculoskeletal: Acute right eighth, ninth and tenth posterior rib fracture. Vertebral body heights are maintained. Review of the MIP images confirms the above findings. IMPRESSION: 1. Negative for acute pulmonary embolus or aortic dissection. 2. No CT evidence for acute solid organ injury within the abdomen or pelvis. 3. Acute right posterior eighth through ninth rib fractures. No pneumothorax. 4. 8 mm ground-glass nodule at the apex of the right upper lobe. Initial follow-up with CT at 6-12 months is recommended to confirm persistence. If persistent, repeat CT is recommended every 2 years until 5 years of stability has been established. This recommendation follows the consensus statement: Guidelines for Management of Incidental Pulmonary Nodules Detected on CT Images: From the Fleischner Society 2017; Radiology 2017; 284:228-243. 5. 2.5 cm left retroareolar breast mass. Recommend correlation with outpatient mammographic imaging and ultrasound. Electronically Signed   By: Donavan Foil M.D.   On:  10/06/2017 23:15   Ct Abdomen Pelvis W Contrast  Result Date: 10/06/2017 CLINICAL DATA:  Syncopal episode low back  pain and shoulder pain EXAM: CT ANGIOGRAPHY CHEST CT ABDOMEN AND PELVIS WITH CONTRAST TECHNIQUE: Multidetector CT imaging of the chest was performed using the standard protocol during bolus administration of intravenous contrast. Multiplanar CT image reconstructions and MIPs were obtained to evaluate the vascular anatomy. Multidetector CT imaging of the abdomen and pelvis was performed using the standard protocol during bolus administration of intravenous contrast. CONTRAST:  151mL ISOVUE-370 IOPAMIDOL (ISOVUE-370) INJECTION 76% COMPARISON:  10/06/2017 radiograph, CT 09/08/2015 FINDINGS: CTA CHEST FINDINGS Cardiovascular: Satisfactory opacification of the pulmonary arteries to the segmental level. No evidence of pulmonary embolism. Normal heart size. No pericardial effusion. Mild atherosclerotic calcification of the aorta. No aneurysmal dilatation. No dissection is seen. Mediastinum/Nodes: Midline trachea. No thyroid mass. No significant adenopathy. Esophagus within normal limits Lungs/Pleura: 8 mm ground-glass nodule in the apex of the right upper lobe, series 10, image number 18. No pleural effusion. No focal consolidation. No pneumothorax. Musculoskeletal: Degenerative changes of the spine. No suspicious lesion. Within the retroareolar left breast, there is a 2.5 cm lobulated mass Review of the MIP images confirms the above findings. CT ABDOMEN and PELVIS FINDINGS Hepatobiliary: No focal liver abnormality is seen. No gallstones, gallbladder wall thickening, or biliary dilatation. Pancreas: Unremarkable. No pancreatic ductal dilatation or surrounding inflammatory changes. Spleen: Normal in size without focal abnormality. Adrenals/Urinary Tract: Adrenal glands are unremarkable. Kidneys are normal, without renal calculi, focal lesion, or hydronephrosis. Bladder is unremarkable. Stomach/Bowel:  Stomach is within normal limits. Appendix appears normal. No evidence of bowel wall thickening, distention, or inflammatory changes. Vascular/Lymphatic: Aortic atherosclerosis. No enlarged abdominal or pelvic lymph nodes. Reproductive: Status post hysterectomy. No adnexal masses. Other: Negative for free air or free fluid. Tiny fat in the umbilicus Musculoskeletal: Acute right eighth, ninth and tenth posterior rib fracture. Vertebral body heights are maintained. Review of the MIP images confirms the above findings. IMPRESSION: 1. Negative for acute pulmonary embolus or aortic dissection. 2. No CT evidence for acute solid organ injury within the abdomen or pelvis. 3. Acute right posterior eighth through ninth rib fractures. No pneumothorax. 4. 8 mm ground-glass nodule at the apex of the right upper lobe. Initial follow-up with CT at 6-12 months is recommended to confirm persistence. If persistent, repeat CT is recommended every 2 years until 5 years of stability has been established. This recommendation follows the consensus statement: Guidelines for Management of Incidental Pulmonary Nodules Detected on CT Images: From the Fleischner Society 2017; Radiology 2017; 284:228-243. 5. 2.5 cm left retroareolar breast mass. Recommend correlation with outpatient mammographic imaging and ultrasound. Electronically Signed   By: Donavan Foil M.D.   On: 10/06/2017 23:15   Dg Hip Unilat W Or Wo Pelvis 2-3 Views Right  Result Date: 10/06/2017 CLINICAL DATA:  61 year old female with fall and right hip pain. EXAM: DG HIP (WITH OR WITHOUT PELVIS) 2-3V RIGHT COMPARISON:  None. FINDINGS: There is no acute fracture or dislocation. Minimal arthritic changes. The soft tissues are grossly unremarkable. IMPRESSION: Negative. Electronically Signed   By: Anner Crete M.D.   On: 10/06/2017 18:51    EKG: Orders placed or performed during the hospital encounter of 10/06/17  . EKG 12-Lead  . EKG 12-Lead  . ED EKG  . ED EKG     IMPRESSION AND PLAN: 61 year old female patient with history of bronchial asthma, GERD, glaucoma and hypertension presented to the emergency room after passing out.  Admitting diagnosis 1. Syncope 2. Right-sided rib fracture 3. Lung nodule 4. GERD 5. Hypertension Treatment plan Admit patient to telemetry  Cycle troponin Check echocardiogram Pain management with the IV morphine Follow up with CT chest in 3-6 months for reassessment of lung nodule as outpatient Monitor for any arrhythmia  All the records are reviewed and case discussed with ED provider. Management plans discussed with the patient, family and they are in agreement.  CODE STATUS:FULL CODE Code Status History    Date Active Date Inactive Code Status Order ID Comments User Context   12/28/2016 17:46 12/30/2016 20:06 Full Code [id number]  Loletha Grayer, MD ED       TOTAL TIME TAKING CARE OF THIS PATIENT: 53 minutes.    Saundra Shelling M.D on 10/07/2017 at 3:26 AM  Between 7am to 6pm - Pager - [phone]  After 6pm go to [email or web address] - password [email or web address] [facility]  Office  [phone]  CC: Primary care physician; Petra Kuba, MD

## 2017-10-07 NOTE — Progress Notes (Signed)
IS given to pt, demonstrated and educated.

## 2017-10-07 NOTE — Evaluation (Signed)
Physical Therapy Evaluation Patient Details Name: IVIONNA VERLEY MRN: [id number] DOB: 01-10-1956 Today's Date: 10/07/2017   History of Present Illness  Pt admitted for complaints of LOC s/p fall. History includes asthma, GERD, glaucoma, and HTN. Negative orthostatics this date, however + R rib fractures on 8th-9th rib.   Clinical Impression  Pt is a pleasant 61 year old female who was admitted for complaints of LOC s/p fall. Pt demonstrates all bed mobility/transfers/ambulation at baseline level without AD. No complaints of dizziness noted with mobility efforts, appears safe to dc in house. All mobility performed on room air with sats at 94%, no SOB symptoms, although does report pain with deep inspirations secondary to rib fractures. Pt does not require any further PT needs at this time. Pt will be dc in house and does not require follow up. RN aware. Will dc current orders.      Follow Up Recommendations No PT follow up    Equipment Recommendations  None recommended by PT    Recommendations for Other Services       Precautions / Restrictions Precautions Precautions: Fall Restrictions Weight Bearing Restrictions: No      Mobility  Bed Mobility Overal bed mobility: Modified Independent             General bed mobility comments: uses railing for support. Once seated at EOB, sits with upright posture.l  Transfers Overall transfer level: Modified independent Equipment used: None             General transfer comment: uses bed to push up, slow transfer, however steady and able to stand without assist. No use of AD  Ambulation/Gait Ambulation/Gait assistance: Min guard Ambulation Distance (Feet): 200 Feet Assistive device: None Gait Pattern/deviations: Step-through pattern     General Gait Details: ambulated around RN station, able to carry conversation during ambulation, no dizziness noted. Safe technique performed  Stairs            Wheelchair Mobility     Modified Rankin (Stroke Patients Only)       Balance Overall balance assessment: History of Falls;Modified Independent                                           Pertinent Vitals/Pain Pain Assessment: No/denies pain    Home Living Family/patient expects to be discharged to:: Private residence Living Arrangements: Alone Available Help at Discharge: Family Type of Home: Mobile home Home Access: Stairs to enter Entrance Stairs-Rails: Can reach both Entrance Stairs-Number of Steps: 5 Home Layout: One level Home Equipment: Walker - 2 wheels;Cane - quad      Prior Function Level of Independence: Independent         Comments: with all ADLs. No falls history     Hand Dominance        Extremity/Trunk Assessment   Upper Extremity Assessment Upper Extremity Assessment: Overall WFL for tasks assessed    Lower Extremity Assessment Lower Extremity Assessment: Overall WFL for tasks assessed       Communication   Communication: No difficulties  Cognition Arousal/Alertness: Awake/alert Behavior During Therapy: WFL for tasks assessed/performed Overall Cognitive Status: Within Functional Limits for tasks assessed  General Comments      Exercises     Assessment/Plan    PT Assessment Patent does not need any further PT services  PT Problem List         PT Treatment Interventions      PT Goals (Current goals can be found in the Care Plan section)  Acute Rehab PT Goals Patient Stated Goal: to go home PT Goal Formulation: All assessment and education complete, DC therapy Time For Goal Achievement: 10/07/17 Potential to Achieve Goals: Good    Frequency     Barriers to discharge        Co-evaluation               AM-PAC PT "6 Clicks" Daily Activity  Outcome Measure Difficulty turning over in bed (including adjusting bedclothes, sheets and blankets)?: None Difficulty moving  from lying on back to sitting on the side of the bed? : None Difficulty sitting down on and standing up from a chair with arms (e.g., wheelchair, bedside commode, etc,.)?: None Help needed moving to and from a bed to chair (including a wheelchair)?: None Help needed walking in hospital room?: None Help needed climbing 3-5 steps with a railing? : None 6 Click Score: 24    End of Session Equipment Utilized During Treatment: Gait belt Activity Tolerance: Patient tolerated treatment well Patient left: in bed;with bed alarm set;with family/visitor present(seated at EOB) Nurse Communication: Mobility status PT Visit Diagnosis: History of falling (Z91.81);Difficulty in walking, not elsewhere classified (R26.2)    Time: 1610-9604 PT Time Calculation (min) (ACUTE ONLY): 19 min   Charges:   PT Evaluation $PT Eval Low Complexity: 1 Low PT Treatments $Gait Training: 8-22 mins   PT G Codes:   PT G-Codes **NOT FOR INPATIENT CLASS** Functional Assessment Tool Used: AM-PAC 6 Clicks Basic Mobility Functional Limitation: Mobility: Walking and moving around Mobility: Walking and Moving Around Current Status (V4098): 0 percent impaired, limited or restricted Mobility: Walking and Moving Around Goal Status (J1914): 0 percent impaired, limited or restricted Mobility: Walking and Moving Around Discharge Status (N8295): 0 percent impaired, limited or restricted    Greggory Stallion, PT, DPT [phone]   Laytoya Ion 10/07/2017, 4:04 PM

## 2017-10-08 DIAGNOSIS — R55 Syncope and collapse: Secondary | ICD-10-CM | POA: Diagnosis not present

## 2017-10-08 LAB — BASIC METABOLIC PANEL
Anion gap: 8 (ref 5–15)
BUN: 13 mg/dL (ref 6–20)
CHLORIDE: 102 mmol/L (ref 101–111)
CO2: 28 mmol/L (ref 22–32)
CREATININE: 0.67 mg/dL (ref 0.44–1.00)
Calcium: 8.9 mg/dL (ref 8.9–10.3)
GFR calc non Af Amer: >60 mL/min (ref >60)
Glucose, Bld: 105 mg/dL — ABNORMAL HIGH (ref 65–99)
POTASSIUM: 3.8 mmol/L (ref 3.5–5.1)
SODIUM: 138 mmol/L (ref 135–145)

## 2017-10-08 LAB — GLUCOSE, CAPILLARY: GLUCOSE-CAPILLARY: 121 mg/dL — AB (ref 65–99)

## 2017-10-08 LAB — MAGNESIUM: Magnesium: 2.2 mg/dL (ref 1.7–2.4)

## 2017-10-08 LAB — HIV ANTIBODY (ROUTINE TESTING W REFLEX): HIV Screen 4th Generation wRfx: NONREACTIVE

## 2017-10-08 MED ORDER — HYDROCODONE-ACETAMINOPHEN 5-325 MG PO TABS
1.0000 | ORAL_TABLET | Freq: Four times a day (QID) | ORAL | 0 refills | Status: DC | PRN
Start: 2017-10-08 — End: 2017-11-26

## 2017-10-08 NOTE — Discharge Summary (Addendum)
[facility] NAME: Selena Walker    MR#:  [id number]  DATE OF BIRTH:  Mar 25, 1956  DATE OF ADMISSION:  10/06/2017   ADMITTING PHYSICIAN: Saundra Shelling, MD  DATE OF DISCHARGE: 10/08/2017  PRIMARY CARE PHYSICIAN: Petra Kuba, MD   ADMISSION DIAGNOSIS:  Right flank pain [R10.9] Fall, initial encounter [W19.XXXA] Closed fracture of multiple ribs of right side, initial encounter [S22.41XA] Syncope, unspecified syncope type [R55] DISCHARGE DIAGNOSIS:  Active Problems:   Syncope  SECONDARY DIAGNOSIS:   Past Medical History:  Diagnosis Date  . Asthma   . GERD (gastroesophageal reflux disease)   . Glaucoma   . Hypertension    HOSPITAL COURSE:   1. Syncope.  Unclear etiology.  Could be vasovagal reaction. No CT evidence for acute solid organ injury within the abdomen or Pelvis. No PE.  The patient had echocardiogram and carotid duplex this February which were unremarkable.  She has no complaints.  Of headache, dizziness, nausea, chest pain or weakness or numbness.  2. Hypokalemia.    Improved with potassium supplement.  Magnesium is normal. 3. Right-sided rib fracture and pain.  As needed pain medication 4. Lung nodule seen on CT scan will need repeat CT scan in 6-12 months. 5. Breast mass seen on CT scan.  Mammogram negative within the year.  Ultrasound of the breast as outpatient. 6. Hyperlipidemia unspecified on atorvastatin 7. Glaucoma continue medications DISCHARGE CONDITIONS:  Stable, discharge to home today. CONSULTS OBTAINED:   DRUG ALLERGIES:   Allergies  Allergen Reactions  . Penicillins   . Ivp Dye [Iodinated Diagnostic Agents] Rash  . Penicillin V Rash  . Shellfish Allergy Rash   DISCHARGE MEDICATIONS:   Allergies as of 10/08/2017      Reactions   Penicillins    Ivp Dye [iodinated Diagnostic Agents] Rash   Penicillin V Rash   Shellfish Allergy Rash      Medication List    STOP taking these  medications   ibuprofen 200 MG tablet Commonly known as:  ADVIL,MOTRIN     TAKE these medications   albuterol 108 (90 Base) MCG/ACT inhaler Commonly known as:  PROVENTIL HFA;VENTOLIN HFA Inhale 2 puffs into the lungs every 4 (four) hours as needed for wheezing or shortness of breath.   alum & mag hydroxide-simeth 200-200-20 MG/5ML suspension Commonly known as:  MAALOX/MYLANTA Take 5 mLs by mouth every 6 (six) hours as needed for indigestion or heartburn.   aspirin 81 MG EC tablet Take 1 tablet (81 mg total) by mouth daily.   atorvastatin 40 MG tablet Commonly known as:  LIPITOR Take 1 tablet (40 mg total) by mouth daily at 6 PM.   cetirizine 10 MG tablet Commonly known as:  ZYRTEC Take 10 mg at bedtime by mouth.   chlorthalidone 25 MG tablet Commonly known as:  HYGROTON Take 25 mg by mouth daily.   dorzolamide-timolol 22.3-6.8 MG/ML ophthalmic solution Commonly known as:  COSOPT Place 1 drop into both eyes 2 (two) times daily.   HYDROcodone-acetaminophen 5-325 MG tablet Commonly known as:  NORCO/VICODIN Take 1 tablet every 6 (six) hours as needed by mouth for moderate pain or severe pain.        DISCHARGE INSTRUCTIONS:  See AVS.  If you experience worsening of your admission symptoms, develop shortness of breath, life threatening emergency, suicidal or homicidal thoughts you must seek medical attention immediately by calling 911 or calling your MD immediately  if symptoms less severe.  You Must read complete instructions/literature along with all the possible adverse reactions/side effects for all the Medicines you take and that have been prescribed to you. Take any new Medicines after you have completely understood and accpet all the possible adverse reactions/side effects.   Please note  You were cared for by a hospitalist during your hospital stay. If you have any questions about your discharge medications or the care you received while you were in the hospital  after you are discharged, you can call the unit and asked to speak with the hospitalist on call if the hospitalist that took care of you is not available. Once you are discharged, your primary care physician will handle any further medical issues. Please note that NO REFILLS for any discharge medications will be authorized once you are discharged, as it is imperative that you return to your primary care physician (or establish a relationship with a primary care physician if you do not have one) for your aftercare needs so that they can reassess your need for medications and monitor your lab values.    On the day of Discharge:  VITAL SIGNS:  Blood pressure (!) 154/67, pulse (!) 103, temperature (!) 97.4 F (36.3 C), temperature source Oral, resp. rate 18, height 5\' 6"  (1.676 m), weight 222 lb 3.2 oz (100.8 kg), SpO2 96 %. PHYSICAL EXAMINATION:  GENERAL:  60 y.o.-year-old patient lying in the bed with no acute distress.  EYES: Pupils equal, round, reactive to light and accommodation. No scleral icterus. Extraocular muscles intact.  HEENT: Head atraumatic, normocephalic. Oropharynx and nasopharynx clear.  NECK:  Supple, no jugular venous distention. No thyroid enlargement, no tenderness.  LUNGS: Normal breath sounds bilaterally, no wheezing, rales,rhonchi or crepitation. No use of accessory muscles of respiration.  CARDIOVASCULAR: S1, S2 normal. No murmurs, rubs, or gallops.  ABDOMEN: Soft, non-tender, non-distended. Bowel sounds present. No organomegaly or mass.  EXTREMITIES: No pedal edema, cyanosis, or clubbing.  NEUROLOGIC: Cranial nerves II through XII are intact. Muscle strength 5/5 in all extremities. Sensation intact. Gait not checked.  PSYCHIATRIC: The patient is alert and oriented x 3.  SKIN: No obvious rash, lesion, or ulcer.  DATA REVIEW:   CBC Recent Labs  Lab 10/07/17 0638  WBC 12.5*  HGB 11.5*  HCT 35.6  PLT 344    Chemistries  Recent Labs  Lab 10/08/17 0654  NA 138    K 3.8  CL 102  CO2 28  GLUCOSE 105*  BUN 13  CREATININE 0.67  CALCIUM 8.9  MG 2.2     Microbiology Results  Results for orders placed or performed during the hospital encounter of 12/28/16  Urine culture     Status: Abnormal   Collection Time: 12/28/16  1:43 PM  Result Value Ref Range Status   Specimen Description URINE, RANDOM  Final   Special Requests NONE  Final   Culture (A)  Final    <10,000 COLONIES/mL INSIGNIFICANT GROWTH Performed at [facility], [place] [address], [place], [place] [address]    Report Status 12/30/2016 FINAL  Final  Gastrointestinal Panel by PCR , Stool     Status: None   Collection Time: 12/28/16 11:14 PM  Result Value Ref Range Status   Campylobacter species NOT DETECTED NOT DETECTED Final   Plesimonas shigelloides NOT DETECTED NOT DETECTED Final   Salmonella species NOT DETECTED NOT DETECTED Final   Yersinia enterocolitica NOT DETECTED NOT DETECTED Final   Vibrio species NOT DETECTED NOT DETECTED Final   Vibrio  cholerae NOT DETECTED NOT DETECTED Final   Enteroaggregative E coli (EAEC) NOT DETECTED NOT DETECTED Final   Enteropathogenic E coli (EPEC) NOT DETECTED NOT DETECTED Final   Enterotoxigenic E coli (ETEC) NOT DETECTED NOT DETECTED Final   Shiga like toxin producing E coli (STEC) NOT DETECTED NOT DETECTED Final   Shigella/Enteroinvasive E coli (EIEC) NOT DETECTED NOT DETECTED Final   Cryptosporidium NOT DETECTED NOT DETECTED Final   Cyclospora cayetanensis NOT DETECTED NOT DETECTED Final   Entamoeba histolytica NOT DETECTED NOT DETECTED Final   Giardia lamblia NOT DETECTED NOT DETECTED Final   Adenovirus F40/41 NOT DETECTED NOT DETECTED Final   Astrovirus NOT DETECTED NOT DETECTED Final   Norovirus GI/GII NOT DETECTED NOT DETECTED Final   Rotavirus A NOT DETECTED NOT DETECTED Final   Sapovirus (I, II, IV, and V) NOT DETECTED NOT DETECTED Final  C difficile quick scan w PCR reflex     Status: None   Collection Time: 12/28/16 11:14  PM  Result Value Ref Range Status   C Diff antigen NEGATIVE NEGATIVE Final   C Diff toxin NEGATIVE NEGATIVE Final   C Diff interpretation No C. difficile detected.  Final    RADIOLOGY:  No results found.   Management plans discussed with the patient, family and they are in agreement.  CODE STATUS: Full Code   TOTAL TIME TAKING CARE OF THIS PATIENT: 26 minutes.    Demetrios Loll M.D on 10/08/2017 at 3:47 PM  Between 7am to 6pm - Pager - [phone]  After 6pm go to [email or web address] - Proofreader  [facility]  Office  [phone]  CC: Primary care physician; Petra Kuba, MD   Note: This dictation was prepared with Dragon dictation along with smaller phrase technology. Any transcriptional errors that result from this process are unintentional.

## 2017-10-08 NOTE — Care Management (Signed)
Informed that there are no discharge needs.  No oxygen requirements

## 2017-10-08 NOTE — Discharge Instructions (Signed)
Heart healthy diet. 1. Lung nodule seen on CT scan will need repeat CT scan in 6-12 months. 2. Breast mass seen on CT scan.  Mammogram negative within the year.  ultrasound of the breast as outpatient.

## 2017-10-08 NOTE — Care Management Important Message (Signed)
Important Message  Patient Details  Name: Selena Walker MRN: [id number] Date of Birth: February 27, 1956   Medicare Important Message Given:  N/A - LOS <3 / Initial given by admissions    Katrina Stack, RN 10/08/2017, 2:10 PM

## 2017-10-15 ENCOUNTER — Other Ambulatory Visit: Payer: Self-pay | Admitting: Family Medicine

## 2017-10-15 DIAGNOSIS — N63 Unspecified lump in unspecified breast: Secondary | ICD-10-CM

## 2017-11-26 ENCOUNTER — Ambulatory Visit (INDEPENDENT_AMBULATORY_CARE_PROVIDER_SITE_OTHER): Payer: Medicare HMO | Admitting: Nurse Practitioner

## 2017-11-26 ENCOUNTER — Encounter: Payer: Self-pay | Admitting: Nurse Practitioner

## 2017-11-26 VITALS — BP 130/64 | HR 84 | Ht 66.0 in | Wt 222.8 lb

## 2017-11-26 DIAGNOSIS — R002 Palpitations: Secondary | ICD-10-CM

## 2017-11-26 DIAGNOSIS — R55 Syncope and collapse: Secondary | ICD-10-CM

## 2017-11-26 DIAGNOSIS — I10 Essential (primary) hypertension: Secondary | ICD-10-CM | POA: Diagnosis not present

## 2017-11-26 NOTE — Progress Notes (Signed)
Cardiology Clinic Note   Patient Name: Selena Walker Date of Encounter: 11/26/2017  Primary Care Provider:  Petra Kuba, MD Primary Cardiologist:  New  Patient Profile    62 y/o ? with a history of asthma, depression, GERD, glaucoma, hypertension, hyperlipidemia, migraines, and recent hospital evaluation for syncope and rib fractures who presents for cardiology evaluation secondary to palpitations and syncope.  Past Medical History    Past Medical History:  Diagnosis Date  . Asthma   . Depression   . GERD (gastroesophageal reflux disease)   . Glaucoma   . History of echocardiogram    a. 12/2016 Echo: EF 60-65%.  . History of stress test    a. 04/2015 ETT: Ex time 3:12, Max HR 162, BP max 235/115. No ischemic changes.  . Hyperlipidemia   . Hypertension   . Left breast mass    a. 09/2017 CTA Chest: 2.5 cm Left retroareolar breast mass - rec outpt f/u.  Marland Kitchen Meningioma ([place])    a. 12/2016 MRI Brain: 51mm parafalcine meningioma, stable since 2013.  . Migraines   . Nodule of upper lobe of right lung    a. 09/2017 CTA chest: 39mm ground glass nodule @ apex of RUL (rec repeat in 6-12 mos).  . Obstructive sleep apnea    a. She has undergone sleep study but has not yet been fitted w/ CPAP.  Marland Kitchen Rib fractures    a. 09/2017 s/p fall w/ R posterior 8th-9th rib fx.  . Syncope    a. 09/2017 - ? etiology.  Marland Kitchen TIA (transient ischemic attack)    a. ? TIA 12/2016 vs Migraine w/ Aura - followed by Neurology.   Past Surgical History:  Procedure Laterality Date  . BREAST BIOPSY Right    neg  . COLONOSCOPY  2007  . COLONOSCOPY WITH PROPOFOL N/A 02/19/2016   Procedure: COLONOSCOPY WITH PROPOFOL;  Surgeon: Manya Silvas, MD;  [facility] END[facility]COPY;  Service: Endoscopy;  Laterality: N/A;  . Lap TAHBSO  12/19/2014    Allergies  Allergies  Allergen Reactions  . Penicillins Hives  . Ivp Dye [Iodinated Diagnostic Agents] Rash  . Metrizamide Rash  . Other Rash  . Penicillin V Rash    . Shellfish Allergy Rash    History of Present Illness    62 year old female with the above complex past medical history including asthma, depression, GERD, glaucoma, hypertension, and hyperlipidemia.  She has no prior cardiac history though she did have an evaluation with an exercise treadmill test in 2016 which showed poor exercise tolerance, hypertensive response to exercise, but no ST or T changes.  In February 2018, she was admitted to [facility] in the setting of her TIA-like symptoms.  She ruled out for stroke by CT and MRI.  MRI did show a 6 mm meningioma which was stable in size since 2013.  Echocardiogram during hospitalization showed normal LV function.  She has since been followed by neurology and it is felt that symptoms in February 2018 were most likely associated with a migraine.   In early November of this year, she was at a friend's house helping to hang something on a wall.  She was on a stepladder and was up about 3 feet.  She says that she looked down and noticed that her she was untied.  She remained standing on the ladder and asked her friends to tie her shoe.  She then had sudden loss of consciousness.  She fell to the floor and regained  consciousness shortly thereafter.  She had no prodromal symptoms of lightheadedness or dizziness prior to losing consciousness.  Upon becoming arousable, she was slightly disoriented but was not experiencing any chest pain, dyspnea, nausea, diaphoresis, or lightheadedness.  Her friend took her to [facility] where CT and MRI imaging was nonacute.  She had significant right-sided back pain and CT angios of the chest, abdomen, and pelvis was performed and was negative for PE and dissection.  She was noted to have acute right posterior eighth through ninth rib fractures but no pneumothorax.  Incidental findings of an 8 mm groundglass nodule at the apex of the right upper lobe as well as a 2.5 cm left retroareolar breast mass were noted.  She  was subsequently discharged and has since followed up with primary care.  She reported to her primary care provider that she experiences palpitations several times per week.  Ms. Lasure says that she will sometimes awaken suddenly at night, about 2-3 times per week, with tachypalpitations associated with mild lightheadedness.  This typically last 2-3 minutes and resolve spontaneously.  This is never occurred during the day and only occurs at night.  Of note, she was diagnosed with sleep apnea by sleep study in 2018 but has not yet been fitted for CPAP.  She plans to follow-up on that after cardiology evaluation is complete.  Ms. Luedke exercises most days of the week.  She says she speed walks around a track which takes her about 5 minutes.  That is the extent of her exercise.  She does not experience chest pain with exercise but does experience dyspnea, though she notes she pushes it pretty hard.  She denies any history of dyspnea with usual activities and has no trouble performing ADLs or chores in and around her house.  She denies PND, orthopnea, edema, or early satiety.  Home Medications    Prior to Admission medications   Medication Sig Start Date End Date Taking? Authorizing Provider  albuterol (PROVENTIL HFA;VENTOLIN HFA) 108 (90 Base) MCG/ACT inhaler Inhale 2 puffs into the lungs every 4 (four) hours as needed for wheezing or shortness of breath.   Yes [provider]  alum & mag hydroxide-simeth (MAALOX/MYLANTA) 200-200-20 MG/5ML suspension Take 5 mLs by mouth every 6 (six) hours as needed for indigestion or heartburn.   Yes [provider]  aspirin EC 81 MG EC tablet Take 1 tablet (81 mg total) by mouth daily. 12/29/16  Yes Hillary Bow, MD  cetirizine (ZYRTEC) 10 MG tablet Take 10 mg at bedtime by mouth.   Yes [provider]  chlorthalidone (HYGROTON) 25 MG tablet Take 25 mg by mouth daily.   Yes [provider]  dorzolamide-timolol (COSOPT) 22.3-6.8 MG/ML  ophthalmic solution Place 1 drop into both eyes 2 (two) times daily. 04/15/16  Yes [provider]    Family History    Family History  Problem Relation Age of Onset  . Breast cancer Sister   . CAD Mother   . Diabetes Mother   . Hypertension Mother   . Migraines Mother   . Hypertension Father   . Stomach cancer Father   . CAD Father   . Colon polyps Father   . Cancer Brother   . Lung cancer Sister   . Diabetes Sister   . Multiple sclerosis Sister   . Hypertension Brother   . Hypertension Brother   . Hypertension Brother   . Hypertension Brother    indicated that her mother is deceased. She  indicated that her father is deceased. She indicated that two of her three sisters are alive. She indicated that four of her five brothers are alive.  Social History    Social History   Socioeconomic History  . Marital status: Widowed    Spouse name: Not on file  . Number of children: Not on file  . Years of education: Not on file  . Highest education level: Not on file  Social Needs  . Financial resource strain: Not on file  . Food insecurity - worry: Not on file  . Food insecurity - inability: Not on file  . Transportation needs - medical: Not on file  . Transportation needs - non-medical: Not on file  Occupational History  . Not on file  Tobacco Use  . Smoking status: Never Smoker  . Smokeless tobacco: Never Used  Substance and Sexual Activity  . Alcohol use: No  . Drug use: No  . Sexual activity: No  Other Topics Concern  . Not on file  Social History Narrative   Lives in [place] by herself.  Three grown sons - all married and out of the house.  Walks most days of the week.     Review of Systems    General:  No chills, fever, night sweats or weight changes.  Cardiovascular:  No chest pain, +++ dyspnea on heavy exertion - speed walking, no edema, orthopnea, +++ palpitations as outlined above, no paroxysmal nocturnal dyspnea.  Sudden syncope in  November. Dermatological: No rash, lesions/masses Respiratory: No cough, +++ dyspnea with speed walking - otw no DOE. Urologic: No hematuria, dysuria Abdominal:   No nausea, vomiting, diarrhea, bright red blood per rectum, melena, or hematemesis Neurologic:  No visual changes, wkns, changes in mental status. All other systems reviewed and are otherwise negative except as noted above.  Physical Exam    VS:  BP 130/64 (BP Location: Left Arm, Patient Position: Sitting, Cuff Size: Normal)   Pulse 84   Ht 5\' 6"  (1.676 m)   Wt 222 lb 12 oz (101 kg)   BMI 35.95 kg/m  , BMI Body mass index is 35.95 kg/m. GEN: Well nourished, well developed, in no acute distress.  HEENT: normal.  Neck: Supple, no JVD, carotid bruits, or masses. Cardiac: RRR, no murmurs, rubs, or gallops. No clubbing, cyanosis, edema.  Radials/DP/PT 2+ and equal bilaterally.  Respiratory:  Respirations regular and unlabored, clear to auscultation bilaterally. GI: Soft, nontender, nondistended, BS + x 4. MS: no deformity or atrophy. Skin: warm and dry, no rash. Neuro:  Strength and sensation are intact. Psych: Normal affect.  Accessory Clinical Findings    ECG -regular sinus rhythm, 84, left axis deviation, no acute ST or T changes  Lab Results  Component Value Date   CREATININE 0.67 10/08/2017   BUN 13 10/08/2017   NA 138 10/08/2017   K 3.8 10/08/2017   CL 102 10/08/2017   CO2 28 10/08/2017    Lab Results  Component Value Date   WBC 12.5 (H) 10/07/2017   HGB 11.5 (L) 10/07/2017   HCT 35.6 10/07/2017   MCV 82.8 10/07/2017   PLT 344 10/07/2017   Assessment & Plan   1.  Syncope: Patient suffered a syncopal episode in November 2018 that occurred while standing on a stepladder.  She had not recently changed positions.  She had no warning symptoms and other than mild disorientation, did not have any significant symptoms after regaining consciousness.  She was worked up at [facility]  and ruled out for stroke.   No reported events on telemetry.  Echocardiogram was not performed during hospitalization but it is notable that she had normal LV function by echo in February 2018.  She has had no recurrence of presyncope or syncope.  She does report palpitations occurring 2-3 times per week, exclusively at night, awakening her from sleep, associated with mild lightheadedness, lasting 2-3 minutes, and resolving spontaneously.  We will place a 30-day event monitor to assess for arrhythmias.  2.  Palpitations: As above, patient experiences palpitations 2-3 times per week exclusively at night.  Symptoms are associated with mild lightheadedness, last 2-3 minutes, and resolve spontaneously.  She has never had symptoms during the daytime.  She has been experiencing these palpitations for about a year to year and a half.  She has never worn a monitor.  Echocardiography showed normal LV function in February 2018.  I will place a 30-day event monitor to assess for arrhythmias.  I will also check a TSH.  Of note, she was recently diagnosed with sleep apnea but has not yet been fitted for CPAP.  I question if she is experiencing apneic episodes and awakening suddenly with associated sinus tachycardia.  Monitoring will help to determine whether or not this is the case.  3.  Essential hypertension: Stable on diuretic therapy.  4.  Migraine headaches: Followed by neurology.  No recent headaches.  5.  Obstructive sleep apnea: Previously diagnosed though she has not yet been fitted with CPAP.  She plans to do that within the next few months.  We discussed the importance of treatment of sleep apnea.    6.  Disposition: Follow-up 30-day event monitor and TSH.  Follow-up in clinic in approximately 6 weeks after completion of event monitor.  Murray Hodgkins, NP 11/26/2017, 11:54 AM

## 2017-11-26 NOTE — Patient Instructions (Signed)
Medication Instructions: - Your physician recommends that you continue on your current medications as directed. Please refer to the Current Medication list given to you today.  Labwork: - Your physician recommends that you have lab work today: TSH  Procedures/Testing: - Your physician has recommended that you wear a 30 day event monitor. Event monitors are medical devices that record the heart's electrical activity. Doctors most often [place] these monitors to diagnose arrhythmias. Arrhythmias are problems with the speed or rhythm of the heartbeat. The monitor is a small, portable device. You can wear one while you do your normal daily activities. This is usually used to diagnose what is causing palpitations/syncope (passing out).  Follow-Up: - Your physician recommends that you schedule a follow-up appointment in: 6 weeks with Ignacia Bayley, NP   Any Additional Special Instructions Will Be Listed Below (If Applicable).     If you need a refill on your cardiac medications before your next appointment, please call your pharmacy.

## 2017-11-27 LAB — TSH: TSH: 0.637 u[IU]/mL (ref 0.450–4.500)

## 2017-12-04 ENCOUNTER — Telehealth: Payer: Self-pay | Admitting: Nurse Practitioner

## 2017-12-04 NOTE — Telephone Encounter (Signed)
Pt states she has not receieved her 30 day monitor. Please call to discuss.

## 2017-12-04 NOTE — Telephone Encounter (Signed)
S/w Hamberto from [employer]. It seems there was an issue with the shipping department and monitor has not shipped yet. He is going to send an email to shipping asking them to overnight the monitor. Therefore, patient should receive it in the next 1-2 days.   Attempted to notify patient. No answer. Left message to call back.

## 2017-12-04 NOTE — Telephone Encounter (Signed)
Patient calling back. Notified her about the monitor. She was very appreciative and will call us on Monday if she has not received the monitor over the weekend.

## 2017-12-08 ENCOUNTER — Other Ambulatory Visit: Payer: Self-pay | Admitting: Family Medicine

## 2017-12-08 DIAGNOSIS — N63 Unspecified lump in unspecified breast: Secondary | ICD-10-CM

## 2017-12-11 ENCOUNTER — Telehealth: Payer: Self-pay | Admitting: Nurse Practitioner

## 2017-12-11 NOTE — Telephone Encounter (Signed)
Fax received from [employer] that the patient has her monitor but they have not been able to hook up the device for the patient. They have attempted to contact her with no success. All numbers provided were tried and have been sent text messages.

## 2017-12-17 ENCOUNTER — Ambulatory Visit (INDEPENDENT_AMBULATORY_CARE_PROVIDER_SITE_OTHER): Payer: Medicare HMO

## 2017-12-17 DIAGNOSIS — R002 Palpitations: Secondary | ICD-10-CM

## 2017-12-17 NOTE — Telephone Encounter (Signed)
Patient in office with [employer] monitor she received in the mail today. Patient is unsure on how to hook up the monitor. I went through initial setup and instructions of monitor with the patient. She was very appreciative and left here with monitor attached and connected to server. Instructed patient to call [employer] with any further device issues.

## 2017-12-17 NOTE — Telephone Encounter (Signed)
Patient in office with monitor and needs help connecting

## 2018-01-07 ENCOUNTER — Ambulatory Visit
Admission: RE | Admit: 2018-01-07 | Discharge: 2018-01-07 | Disposition: A | Discharge status: Home / Self Care | Payer: Medicare HMO | Source: Ambulatory Visit | Attending: Family Medicine | Admitting: Family Medicine

## 2018-01-07 ENCOUNTER — Ambulatory Visit: Payer: Medicare HMO | Admitting: Nurse Practitioner

## 2018-01-07 ENCOUNTER — Telehealth: Payer: Self-pay | Admitting: Nurse Practitioner

## 2018-01-07 DIAGNOSIS — N6320 Unspecified lump in the left breast, unspecified quadrant: Secondary | ICD-10-CM | POA: Diagnosis present

## 2018-01-07 DIAGNOSIS — N63 Unspecified lump in unspecified breast: Secondary | ICD-10-CM

## 2018-01-07 DIAGNOSIS — Z09 Encounter for follow-up examination after completed treatment for conditions other than malignant neoplasm: Secondary | ICD-10-CM | POA: Diagnosis not present

## 2018-01-07 DIAGNOSIS — Z872 Personal history of diseases of the skin and subcutaneous tissue: Secondary | ICD-10-CM | POA: Diagnosis not present

## 2018-01-07 DIAGNOSIS — N6310 Unspecified lump in the right breast, unspecified quadrant: Secondary | ICD-10-CM | POA: Diagnosis present

## 2018-01-07 NOTE — Telephone Encounter (Signed)
I left a message for the patient to call to see if she ever received her heart monitor.

## 2018-01-15 NOTE — Telephone Encounter (Signed)
Spoke with patient and she reports that she did receive heart monitor and had no further questions at this time.

## 2018-01-28 ENCOUNTER — Encounter: Payer: Self-pay | Admitting: Nurse Practitioner

## 2018-01-28 ENCOUNTER — Ambulatory Visit (INDEPENDENT_AMBULATORY_CARE_PROVIDER_SITE_OTHER): Payer: Medicare HMO | Admitting: Nurse Practitioner

## 2018-01-28 VITALS — BP 130/64 | HR 93 | Ht 66.0 in | Wt 227.8 lb

## 2018-01-28 DIAGNOSIS — G4733 Obstructive sleep apnea (adult) (pediatric): Secondary | ICD-10-CM

## 2018-01-28 DIAGNOSIS — R002 Palpitations: Secondary | ICD-10-CM | POA: Diagnosis not present

## 2018-01-28 DIAGNOSIS — R55 Syncope and collapse: Secondary | ICD-10-CM

## 2018-01-28 DIAGNOSIS — I10 Essential (primary) hypertension: Secondary | ICD-10-CM

## 2018-01-28 DIAGNOSIS — E782 Mixed hyperlipidemia: Secondary | ICD-10-CM | POA: Diagnosis not present

## 2018-01-28 NOTE — Patient Instructions (Signed)
Medication Instructions: - Your physician recommends that you continue on your current medications as directed. Please refer to the Current Medication list given to you today.  Labwork: - none ordered  Procedures/Testing: - none ordered  Follow-Up: - We will see you back on an as needed basis.   Any Additional Special Instructions Will Be Listed Below (If Applicable).     If you need a refill on your cardiac medications before your next appointment, please call your pharmacy.

## 2018-01-28 NOTE — Progress Notes (Signed)
Office Visit    Patient Name: Selena Walker Date of Encounter: 01/28/2018  Primary Care Provider:  Petra Kuba, MD Primary Cardiologist:  Will f/u w/ T. Rockey Situ, MD   Chief Complaint    62 y/o ? with a history of asthma, depression, GERD, glaucoma, hypertension, hyperlipidemia, migraines, syncope, and palpitations, who presents for follow-up after recent monitoring.  Past Medical History    Past Medical History:  Diagnosis Date  . Asthma   . Depression   . GERD (gastroesophageal reflux disease)   . Glaucoma   . History of echocardiogram    a. 12/2016 Echo: EF 60-65%.  . History of stress test    a. 04/2015 ETT: Ex time 3:12, Max HR 162, BP max 235/115. No ischemic changes.  . Hyperlipidemia   . Hypertension   . Left breast mass    a. 09/2017 CTA Chest: 2.5 cm Left retroareolar breast mass - rec outpt f/u.  Marland Kitchen Meningioma ([place])    a. 12/2016 MRI Brain: 59mm parafalcine meningioma, stable since 2013.  . Migraines   . Nodule of upper lobe of right lung    a. 09/2017 CTA chest: 66mm ground glass nodule @ apex of RUL (rec repeat in 6-12 mos).  . Obstructive sleep apnea    a. She has undergone sleep study but has not yet been fitted w/ CPAP.  Marland Kitchen Palpitations    a. 12/2017 Event monitor:  No significant arrhythmias.  . Rib fractures    a. 09/2017 s/p fall w/ R posterior 8th-9th rib fx.  . Syncope    a. 09/2017 - ? etiology; b. 12/2017 Event monitor:  No significant arrhythmias.  Marland Kitchen TIA (transient ischemic attack)    a. ? TIA 12/2016 vs Migraine w/ Aura - followed by Neurology.   Past Surgical History:  Procedure Laterality Date  . BREAST EXCISIONAL BIOPSY Right 1990   neg  . COLONOSCOPY  2007  . COLONOSCOPY WITH PROPOFOL N/A 02/19/2016   Procedure: COLONOSCOPY WITH PROPOFOL;  Surgeon: Manya Silvas, MD;  Location: [facility] ENDOSCOPY;  Service: Endoscopy;  Laterality: N/A;  . Lap TAHBSO  12/19/2014    Allergies  Allergies  Allergen Reactions  . Penicillins Hives  .  Ivp Dye [Iodinated Diagnostic Agents] Rash  . Metrizamide Rash  . Other Rash  . Penicillin V Rash  . Shellfish Allergy Rash    History of Present Illness    62 year old female with the above complex past medical history including asthma, depression, GERD, glaucoma, hypertension, and hyperlipidemia.  She had a nonischemic exercise treadmill test in 2016 and normal LV function by echocardiogram in February 2018, when she was admitted with TIA-like symptoms, which were ultimately felt to be secondary to a migraine.  In November 2018, she suffered a syncopal episode and was evaluated at [facility].  CT angios of the chest, abdomen, and pelvis were performed and were negative for PE and dissection she was noted to have acute right posterior eighth through ninth rib fractures, without pneumothorax.  Incidental findings of an 8 mm groundglass nodule at the apex of the right upper lobe as well as a 2.5 cm left retroareolar breast mass were noted.  I saw her in clinic in early January of this year to follow-up the syncopal episode as well as complaints of intermittent palpitations, occurring exclusively at night.  I placed a 30-day event monitor and this showed no significant arrhythmias.  Since her last visit, she says she has done well.  She  is relatively active at home but in the setting of cold weather, she has not been walking outside as frequently as she used to.  She says she did occasionally note palpitations while wearing the monitor but again, no significant arrhythmias were noted.  She has not had any nocturnal palpitations and is currently being worked up for sleep apnea.  She denies chest pain, dyspnea, PND, orthopnea, dizziness, syncope, edema, or early satiety.  Home Medications    Prior to Admission medications   Medication Sig Start Date End Date Taking? Authorizing Provider  albuterol (PROVENTIL HFA;VENTOLIN HFA) 108 (90 Base) MCG/ACT inhaler Inhale 2 puffs into the lungs every 4  (four) hours as needed for wheezing or shortness of breath.   Yes [provider]  alum & mag hydroxide-simeth (MAALOX/MYLANTA) 200-200-20 MG/5ML suspension Take 5 mLs by mouth every 6 (six) hours as needed for indigestion or heartburn.   Yes [provider]  aspirin EC 81 MG EC tablet Take 1 tablet (81 mg total) by mouth daily. 12/29/16  Yes Hillary Bow, MD  cetirizine (ZYRTEC) 10 MG tablet Take 10 mg at bedtime by mouth.   Yes [provider]  chlorthalidone (HYGROTON) 25 MG tablet Take 25 mg by mouth daily.   Yes [provider]  dorzolamide-timolol (COSOPT) 22.3-6.8 MG/ML ophthalmic solution Place 1 drop into both eyes 2 (two) times daily. 04/15/16  Yes [provider]    Review of Systems    She did have occasional, brief episodes of palpitations while wearing the monitor but denies chest pain, dyspnea, PND, orthopnea, dizziness, syncope, edema, or early satiety.  All other systems reviewed and are otherwise negative except as noted above.  Physical Exam    VS:  BP 130/64 (BP Location: Left Arm, Patient Position: Sitting, Cuff Size: Large)   Pulse 93   Ht 5\' 6"  (1.676 m)   Wt 227 lb 12 oz (103.3 kg)   BMI 36.76 kg/m  , BMI Body mass index is 36.76 kg/m. GEN: Well nourished, well developed, in no acute distress.  HEENT: normal.  Neck: Supple, no JVD, carotid bruits, or masses. Cardiac: RRR, no murmurs, rubs, or gallops. No clubbing, cyanosis, edema.  Radials/DP/PT 2+ and equal bilaterally.  Respiratory:  Respirations regular and unlabored, clear to auscultation bilaterally. GI: Soft, nontender, nondistended, BS + x 4. MS: no deformity or atrophy. Skin: warm and dry, no rash. Neuro:  Strength and sensation are intact. Psych: Normal affect.  Accessory Clinical Findings    ECG -regular sinus rhythm, 93, left axis deviation, no acute ST or T changes.  Assessment & Plan    1.  Syncope: Patient suffered a syncopal episode in November  2018.  Neurologic workup was negative in the hospital.  Event monitoring did not show any significant arrhythmias, just rare PVCs, which might account for her symptoms.  Previously known normal LV function by echo in February 2018.  No recurrent syncope.  No further cardiac workup at this time.  2.  Palpitations: When I last saw Ms. Bertelson, she was experiencing intermittent nocturnal palpitations and elevated heart rates.  Event monitoring failed to show any significant arrhythmias, but rare PVCs were noted.  This might account for symptoms of palpitations.  TSH was recently normal.  She has not been having any nocturnal palpitations recently.  She continues to undergo workup for sleep apnea, which I suspect may have been contributing to her symptoms.  3.  Essential hypertension: Stable on chlorthalidone therapy.  4.  Obstructive sleep  apnea: Previously diagnosed though she is not yet been fitted with CPAP.  She says she was waiting until her cardiac evaluation was complete.  I have advised her to follow-up with sleep medicine.  5.  Hyperlipidemia: LDL 153 in February 2018.  Patient also noted to have mild atherosclerotic calcification of the aorta on CTA in November 2018, and minimal bilateral atherosclerotic plaque of the internal carotid arteries in February 2018.  Would recommend statin therapy but will defer to primary care.  6.  Disposition: Patient may follow-up with cardiology as needed.  Murray Hodgkins, NP 01/28/2018, 9:23 AM

## 2018-02-13 NOTE — Congregational Nurse Program (Signed)
Congregational Nurse Program Note  Date of Encounter: 02/13/2018  Past Medical History: Past Medical History:  Diagnosis Date  . Asthma   . Depression   . GERD (gastroesophageal reflux disease)   . Glaucoma   . History of echocardiogram    a. 12/2016 Echo: EF 60-65%.  . History of stress test    a. 04/2015 ETT: Ex time 3:12, Max HR 162, BP max 235/115. No ischemic changes.  . Hyperlipidemia   . Hypertension   . Left breast mass    a. 09/2017 CTA Chest: 2.5 cm Left retroareolar breast mass - rec outpt f/u.  Marland Kitchen Meningioma ([place])    a. 12/2016 MRI Brain: 59mm parafalcine meningioma, stable since 2013.  . Migraines   . Nodule of upper lobe of right lung    a. 09/2017 CTA chest: 82mm ground glass nodule @ apex of RUL (rec repeat in 6-12 mos).  . Obstructive sleep apnea    a. She has undergone sleep study but has not yet been fitted w/ CPAP.  Marland Kitchen Palpitations    a. 12/2017 Event monitor:  No significant arrhythmias.  . Rib fractures    a. 09/2017 s/p fall w/ R posterior 8th-9th rib fx.  . Syncope    a. 09/2017 - ? etiology; b. 12/2017 Event monitor:  No significant arrhythmias.  Marland Kitchen TIA (transient ischemic attack)    a. ? TIA 12/2016 vs Migraine w/ Aura - followed by Neurology.    Encounter Details: CNP Questionnaire - 02/11/18 1015      Questionnaire   Patient Status  Not Applicable    Race  Black or African American    Location Patient Served At  [employer], [employer]    Uninsured  Not Applicable    Food  No food insecurities    Housing/Utilities  Yes, have permanent housing    Transportation  No transportation needs    Interpersonal Safety  Yes, feel physically and emotionally safe where you currently live    Medication  No medication insecurities    Medical Provider  Yes    Referrals  Not Applicable    ED Visit Averted  Not Applicable    Life-Saving Intervention Made  Not Applicable     Came to food pantry and had B/P taken. B/P 129/78 P  88.Erma Heritage RN, [place] [phone]

## 2018-03-18 ENCOUNTER — Other Ambulatory Visit (HOSPITAL_COMMUNITY): Payer: Self-pay | Admitting: Family Medicine

## 2018-03-18 DIAGNOSIS — R918 Other nonspecific abnormal finding of lung field: Secondary | ICD-10-CM

## 2018-04-16 ENCOUNTER — Ambulatory Visit (HOSPITAL_COMMUNITY)
Admission: RE | Admit: 2018-04-16 | Discharge: 2018-04-16 | Disposition: A | Discharge status: Home / Self Care | Payer: Medicare HMO | Source: Ambulatory Visit | Attending: Family Medicine | Admitting: Family Medicine

## 2018-04-16 DIAGNOSIS — I700 Atherosclerosis of aorta: Secondary | ICD-10-CM | POA: Diagnosis not present

## 2018-04-16 DIAGNOSIS — R918 Other nonspecific abnormal finding of lung field: Secondary | ICD-10-CM | POA: Diagnosis present

## 2018-08-23 NOTE — Congregational Nurse Program (Signed)
Congregational Nurse Program Note  Date of Encounter: 08/23/2018  Past Medical History: Past Medical History:  Diagnosis Date  . Asthma   . Depression   . GERD (gastroesophageal reflux disease)   . Glaucoma   . History of echocardiogram    a. 12/2016 Echo: EF 60-65%.  . History of stress test    a. 04/2015 ETT: Ex time 3:12, Max HR 162, BP max 235/115. No ischemic changes.  . Hyperlipidemia   . Hypertension   . Left breast mass    a. 09/2017 CTA Chest: 2.5 cm Left retroareolar breast mass - rec outpt f/u.  Marland Kitchen Meningioma ([place])    a. 12/2016 MRI Brain: 76mm parafalcine meningioma, stable since 2013.  . Migraines   . Nodule of upper lobe of right lung    a. 09/2017 CTA chest: 58mm ground glass nodule @ apex of RUL (rec repeat in 6-12 mos).  . Obstructive sleep apnea    a. She has undergone sleep study but has not yet been fitted w/ CPAP.  Marland Kitchen Palpitations    a. 12/2017 Event monitor:  No significant arrhythmias.  . Rib fractures    a. 09/2017 s/p fall w/ R posterior 8th-9th rib fx.  . Syncope    a. 09/2017 - ? etiology; b. 12/2017 Event monitor:  No significant arrhythmias.  Marland Kitchen TIA (transient ischemic attack)    a. ? TIA 12/2016 vs Migraine w/ Aura - followed by Neurology.    Encounter Details: CNP Questionnaire - 07/29/18 2247      Questionnaire   Patient Status  Not Applicable    Race  Black or African American    Location Patient Served At  [employer], [employer]    Uninsured  Not Applicable    Food  No food insecurities    Housing/Utilities  Yes, have permanent housing    Transportation  No transportation needs    Interpersonal Safety  Yes, feel physically and emotionally safe where you currently live    Medication  No medication insecurities    Medical Provider  Yes    Referrals  Not Applicable    ED Visit Averted  Not Applicable    Life-Saving Intervention Made  Not Applicable      Seen at the [employer].  No problems. B P  [address] Darla Lesches [place] [facility] [phone]

## 2019-05-11 ENCOUNTER — Other Ambulatory Visit: Payer: Self-pay | Admitting: Family Medicine

## 2019-05-11 DIAGNOSIS — Z1231 Encounter for screening mammogram for malignant neoplasm of breast: Secondary | ICD-10-CM

## 2019-06-11 ENCOUNTER — Ambulatory Visit
Admission: RE | Admit: 2019-06-11 | Discharge: 2019-06-11 | Disposition: A | Discharge status: Home / Self Care | Payer: Medicare HMO | Source: Ambulatory Visit | Attending: Family Medicine | Admitting: Family Medicine

## 2019-06-11 ENCOUNTER — Other Ambulatory Visit: Payer: Self-pay

## 2019-06-11 DIAGNOSIS — Z1231 Encounter for screening mammogram for malignant neoplasm of breast: Secondary | ICD-10-CM | POA: Diagnosis not present

## 2020-01-06 ENCOUNTER — Other Ambulatory Visit: Payer: Self-pay | Admitting: Student

## 2020-01-06 DIAGNOSIS — Z0000 Encounter for general adult medical examination without abnormal findings: Secondary | ICD-10-CM

## 2020-01-06 DIAGNOSIS — Z780 Asymptomatic menopausal state: Secondary | ICD-10-CM

## 2020-07-29 ENCOUNTER — Emergency Department: Payer: Medicare HMO

## 2020-07-29 ENCOUNTER — Other Ambulatory Visit: Payer: Self-pay

## 2020-07-29 ENCOUNTER — Encounter: Payer: Self-pay | Admitting: Emergency Medicine

## 2020-07-29 DIAGNOSIS — Z7982 Long term (current) use of aspirin: Secondary | ICD-10-CM | POA: Diagnosis not present

## 2020-07-29 DIAGNOSIS — R002 Palpitations: Secondary | ICD-10-CM | POA: Diagnosis present

## 2020-07-29 DIAGNOSIS — J45909 Unspecified asthma, uncomplicated: Secondary | ICD-10-CM | POA: Diagnosis not present

## 2020-07-29 DIAGNOSIS — I10 Essential (primary) hypertension: Secondary | ICD-10-CM | POA: Insufficient documentation

## 2020-07-29 DIAGNOSIS — Z8673 Personal history of transient ischemic attack (TIA), and cerebral infarction without residual deficits: Secondary | ICD-10-CM | POA: Insufficient documentation

## 2020-07-29 DIAGNOSIS — E876 Hypokalemia: Secondary | ICD-10-CM | POA: Insufficient documentation

## 2020-07-29 DIAGNOSIS — Z79899 Other long term (current) drug therapy: Secondary | ICD-10-CM | POA: Diagnosis not present

## 2020-07-29 LAB — CBC
HCT: 37.8 % (ref 36.0–46.0)
Hemoglobin: 12 g/dL (ref 12.0–15.0)
MCH: 26.7 pg (ref 26.0–34.0)
MCHC: 31.7 g/dL (ref 30.0–36.0)
MCV: 84.2 fL (ref 80.0–100.0)
Platelets: 397 10*3/uL (ref 150–400)
RBC: 4.49 MIL/uL (ref 3.87–5.11)
RDW: 14.5 % (ref 11.5–15.5)
WBC: 17 10*3/uL — ABNORMAL HIGH (ref 4.0–10.5)
nRBC: 0 % (ref 0.0–0.2)

## 2020-07-29 LAB — BASIC METABOLIC PANEL
Anion gap: 9 (ref 5–15)
BUN: 18 mg/dL (ref 8–23)
CO2: 28 mmol/L (ref 22–32)
Calcium: 9.2 mg/dL (ref 8.9–10.3)
Chloride: 103 mmol/L (ref 98–111)
Creatinine, Ser: 1.01 mg/dL — ABNORMAL HIGH (ref 0.44–1.00)
GFR calc Af Amer: >60 mL/min (ref >60)
GFR calc non Af Amer: 58 mL/min — ABNORMAL LOW (ref >60)
Glucose, Bld: 155 mg/dL — ABNORMAL HIGH (ref 70–99)
Potassium: 3.4 mmol/L — ABNORMAL LOW (ref 3.5–5.1)
Sodium: 140 mmol/L (ref 135–145)

## 2020-07-29 LAB — TROPONIN I (HIGH SENSITIVITY): Troponin I (High Sensitivity): 7 ng/L (ref <18)

## 2020-07-29 NOTE — ED Triage Notes (Signed)
Pt arrived via [facility] with reports of chest pain and heart racing that started last night, pt given NGT x1 with relief of pain from 10/10 to 4/10. Pt also received 324 ASA with EMS.  No distress noted at this time.

## 2020-07-29 NOTE — ED Notes (Signed)
Felt heart racing and also happened last night.  EMS vital signs -- HR 140-150, BP 177/110, IV to right antecub via 20 g angiocath, received  245ml fluid, ngt x 1 ( with relief of pan) and asa x4,cbg 160.

## 2020-07-30 ENCOUNTER — Emergency Department
Admission: EM | Admit: 2020-07-30 | Discharge: 2020-07-30 | Disposition: A | Discharge status: Home / Self Care | Payer: Medicare HMO | Attending: Emergency Medicine | Admitting: Emergency Medicine

## 2020-07-30 DIAGNOSIS — E876 Hypokalemia: Secondary | ICD-10-CM

## 2020-07-30 DIAGNOSIS — R002 Palpitations: Secondary | ICD-10-CM | POA: Diagnosis not present

## 2020-07-30 LAB — TROPONIN I (HIGH SENSITIVITY): Troponin I (High Sensitivity): 6 ng/L (ref <18)

## 2020-07-30 MED ORDER — POTASSIUM CHLORIDE 20 MEQ PO PACK
40.0000 meq | PACK | Freq: Once | ORAL | Status: AC
  Administered 2020-07-30: 40 meq via ORAL
  Filled 2020-07-30: qty 2

## 2020-07-30 MED ORDER — MAGNESIUM OXIDE 400 (241.3 MG) MG PO TABS
400.0000 mg | ORAL_TABLET | Freq: Once | ORAL | Status: AC
  Administered 2020-07-30: 400 mg via ORAL
  Filled 2020-07-30: qty 1

## 2020-07-30 NOTE — ED Provider Notes (Addendum)
[facility] Emergency Department Provider Note ____________________________________________   First MD Initiated Contact with Patient 07/30/20 1523     (approximate)  I have reviewed the triage vital signs and the nursing notes.  HISTORY  Chief Complaint Chest Pain   HPI Selena Walker is a 64 y.o. femalewho presents to the ED for evaluation of chest pain  Chart review indicates hx HTN on chlorthalidone, HLD, GERD, depression and heart palpitations.  No history of CAD.     Patient reports being in her typical state of health until developing heart palpitations last night while falling asleep.  Patient primarily reports "feeling my heart" and heart palpitations.  She reports "maybe a little bit" of substernal chest pressure with this, but she minimizes this and indicates a 1/10 aching sensation associated with her heart palpitations.  Patient reports this sensation lasted about 2-3 minutes before self resolving and has not recurred since then.  This was approximately 18 hours ago now as patient has waited in our waiting room for about 16.5 hours prior to rooming, and due to remarkable staff shortages in bed holds in our ED.  During his wait time, patient reports feeling well and has had no recurrence of chest pain or palpitations.  She denies any chest pain, pressure or aching outside of the 1 episode of palpitations last night.  She denies any associated diaphoresis, nausea, vomiting, syncope, shortness of breath, headache or trauma.  She denies changes to medications.  She reports taking potassium supplementation regularly, with no missed doses.    Past Medical History:  Diagnosis Date  . Asthma   . Depression   . GERD (gastroesophageal reflux disease)   . Glaucoma   . History of echocardiogram    a. 12/2016 Echo: EF 60-65%.  . History of stress test    a. 04/2015 ETT: Ex time 3:12, Max HR 162, BP max 235/115. No ischemic changes.  . Hyperlipidemia   .  Hypertension   . Left breast mass    a. 09/2017 CTA Chest: 2.5 cm Left retroareolar breast mass - rec outpt f/u.  Marland Kitchen Meningioma ([place])    a. 12/2016 MRI Brain: 70mm parafalcine meningioma, stable since 2013.  . Migraines   . Nodule of upper lobe of right lung    a. 09/2017 CTA chest: 66mm ground glass nodule @ apex of RUL (rec repeat in 6-12 mos).  . Obstructive sleep apnea    a. She has undergone sleep study but has not yet been fitted w/ CPAP.  Marland Kitchen Palpitations    a. 12/2017 Event monitor:  No significant arrhythmias.  . Rib fractures    a. 09/2017 s/p fall w/ R posterior 8th-9th rib fx.  . Syncope    a. 09/2017 - ? etiology; b. 12/2017 Event monitor:  No significant arrhythmias.  Marland Kitchen TIA (transient ischemic attack)    a. ? TIA 12/2016 vs Migraine w/ Aura - followed by Neurology.    Patient Active Problem List   Diagnosis Date Noted  . Syncope 10/07/2017  . TIA (transient ischemic attack) 12/28/2016    Past Surgical History:  Procedure Laterality Date  . BREAST EXCISIONAL BIOPSY Right 1990   neg  . COLONOSCOPY  2007  . COLONOSCOPY WITH PROPOFOL N/A 02/19/2016   Procedure: COLONOSCOPY WITH PROPOFOL;  Surgeon: Manya Silvas, MD;  Location: [facility] ENDOSCOPY;  Service: Endoscopy;  Laterality: N/A;  . Lap TAHBSO  12/19/2014    Prior to Admission medications   Medication Sig Start Date  End Date Taking? Authorizing Provider  albuterol (PROVENTIL HFA;VENTOLIN HFA) 108 (90 Base) MCG/ACT inhaler Inhale 2 puffs into the lungs every 4 (four) hours as needed for wheezing or shortness of breath.    [provider]  alum & mag hydroxide-simeth (MAALOX/MYLANTA) 200-200-20 MG/5ML suspension Take 5 mLs by mouth every 6 (six) hours as needed for indigestion or heartburn.    [provider]  aspirin EC 81 MG EC tablet Take 1 tablet (81 mg total) by mouth daily. 12/29/16   Hillary Bow, MD  cetirizine (ZYRTEC) 10 MG tablet Take 10 mg at bedtime by mouth.    [provider]   chlorthalidone (HYGROTON) 25 MG tablet Take 25 mg by mouth daily.    [provider]  dorzolamide-timolol (COSOPT) 22.3-6.8 MG/ML ophthalmic solution Place 1 drop into both eyes 2 (two) times daily. 04/15/16   [provider]    Allergies Duloxetine, Escitalopram oxalate, Gabapentin, Penicillins, Shellfish allergy, Ivp dye [iodinated diagnostic agents], Metrizamide, Other, and Penicillin v  Family History  Problem Relation Age of Onset  . Breast cancer Sister 36  . CAD Mother   . Diabetes Mother   . Hypertension Mother   . Migraines Mother   . Hypertension Father   . Stomach cancer Father   . CAD Father   . Colon polyps Father   . Cancer Brother   . Lung cancer Sister   . Diabetes Sister   . Multiple sclerosis Sister   . Hypertension Brother   . Hypertension Brother   . Hypertension Brother   . Hypertension Brother     Social History Social History   Tobacco Use  . Smoking status: Never Smoker  . Smokeless tobacco: Never Used  Vaping Use  . Vaping Use: Never used  Substance Use Topics  . Alcohol use: No  . Drug use: No    Review of Systems  Constitutional: No fever/chills Eyes: No visual changes. ENT: No sore throat. Cardiovascular: Denies chest pain.  Positive for heart palpitations. Respiratory: Denies shortness of breath. Gastrointestinal: No abdominal pain.  No nausea, no vomiting.  No diarrhea.  No constipation. Genitourinary: Negative for dysuria. Musculoskeletal: Negative for back pain. Skin: Negative for rash. Neurological: Negative for headaches, focal weakness or numbness.   ____________________________________________   PHYSICAL EXAM:  VITAL SIGNS: Vitals:   07/29/20 2301 07/30/20 0536  BP: 137/74 135/60  Pulse: (!) 110 86  Resp: 18 18  Temp: 98.8 F (37.1 C) 97.9 F (36.6 C)  SpO2: 97% 98%      Constitutional: Alert and oriented. Well appearing and in no acute distress. Eyes: Conjunctivae are normal. PERRL.  EOMI. Head: Atraumatic. Nose: No congestion/rhinnorhea. Mouth/Throat: Mucous membranes are moist.  Oropharynx non-erythematous. Neck: No stridor. No cervical spine tenderness to palpation. Cardiovascular: Normal rate, regular rhythm. Grossly normal heart sounds.  Good peripheral circulation. Respiratory: Normal respiratory effort.  No retractions. Lungs CTAB. Gastrointestinal: Soft , nondistended, nontender to palpation. No abdominal bruits. No CVA tenderness. Musculoskeletal: No lower extremity tenderness nor edema.  No joint effusions. No signs of acute trauma. Neurologic:  Normal speech and language. No gross focal neurologic deficits are appreciated. No gait instability noted. Skin:  Skin is warm, dry and intact. No rash noted. Psychiatric: Mood and affect are normal. Speech and behavior are normal.  ____________________________________________   LABS (all labs ordered are listed, but only abnormal results are displayed)  Labs Reviewed  BASIC METABOLIC PANEL - Abnormal; Notable for the following components:  Result Value   Potassium 3.4 (*)    Glucose, Bld 155 (*)    Creatinine, Ser 1.01 (*)    GFR calc non Af Amer 58 (*)    All other components within normal limits  CBC - Abnormal; Notable for the following components:   WBC 17.0 (*)    All other components within normal limits  TROPONIN I (HIGH SENSITIVITY)  TROPONIN I (HIGH SENSITIVITY)   ____________________________________________  12 Lead EKG  Sinus rhythm, rate of 114 bpm, normal axis and intervals.  No evidence of acute ischemia.  Sinus tachycardia. ____________________________________________  RADIOLOGY  ED MD interpretation: CXR reviewed without evidence of acute cardiopulmonary pathology  Official radiology report(s): DG Chest 2 View  Result Date: 07/29/2020 CLINICAL DATA:  Chest pain EXAM: CHEST - 2 VIEW COMPARISON:  12/28/2016 FINDINGS: Heart and mediastinal contours are within normal limits. No focal  opacities or effusions. No acute bony abnormality. Degenerative changes in the thoracic spine. IMPRESSION: No active cardiopulmonary disease. Electronically Signed   By: Rolm Baptise M.D.   On: 07/29/2020 23:47    ____________________________________________   PROCEDURES and INTERVENTIONS  Procedure(s) performed (including Critical Care):  Procedures  Medications  potassium chloride (KLOR-CON) packet 40 mEq (has no administration in time range)  magnesium oxide (MAG-OX) tablet 400 mg (has no administration in time range)    ____________________________________________   MDM / ED COURSE  64 year old woman without history of coronary disease presents with isolated heart palpitations, likely due to hypokalemia in the setting of her chlorthalidone usage for HTN, amenable to oral electrolyte repletion and outpatient management.  Patient tachycardic in triage, that self resolves, and vitals otherwise normal on room air.  Examination yields no evidence of acute pathology.  She is well-appearing without distress, tachycardia, neurovascular deficits or signs of trauma.  Blood work demonstrates mild hypokalemia as likely culprit of her symptoms, as well as isolated leukocytosis of uncertain etiology.  She has no symptoms of an infectious process, no signs of pathology on examination to indicate that this leukocytosis is a sign of an additional acute pathology..  EKG is nonischemic and troponin x2 is negative.  Patient is symptom-free here in the ED. Provided oral supplementation of potassium, as well as empiric supplementation of magnesium due to the close relationship.  We discussed outpatient management, including follow-up with PCP within the next week for a electrolyte and WBC recheck.  We discussed outpatient stress testing through her PCP to ensure no evidence of ischemic risk.  We discussed return precautions for the ED.  And patient is medically stable discharge home.       ____________________________________________   FINAL CLINICAL IMPRESSION(S) / ED DIAGNOSES  Final diagnoses:  Hypokalemia  Heart palpitations     ED Discharge Orders    None       Bethann Qualley Tamala Julian   Note:  This document was prepared using Dragon voice recognition software and may include unintentional dictation errors.   Vladimir Crofts, MD 07/30/20 Gisela, [place], MD 07/30/20 [phone]

## 2020-07-30 NOTE — Discharge Instructions (Addendum)
He was seen in the ED because of your heart palpitations.  This is likely due to low electrolytes, as we discussed.  Please continue all your normal medications at home, including your potassium tablets.  Please follow-up with your PCP within the next week to discuss your continued symptoms and to recheck your electrolytes.  If you develop any worsening symptoms, particularly chest pain without heart palpitations, please return to the ED.

## 2020-09-13 ENCOUNTER — Other Ambulatory Visit: Payer: Self-pay | Admitting: Student

## 2020-09-13 DIAGNOSIS — Z1231 Encounter for screening mammogram for malignant neoplasm of breast: Secondary | ICD-10-CM

## 2020-10-06 ENCOUNTER — Emergency Department: Payer: Medicare HMO

## 2020-10-06 ENCOUNTER — Other Ambulatory Visit: Payer: Self-pay

## 2020-10-06 ENCOUNTER — Emergency Department
Admission: EM | Admit: 2020-10-06 | Discharge: 2020-10-06 | Disposition: A | Discharge status: Home / Self Care | Payer: Medicare HMO | Attending: Emergency Medicine | Admitting: Emergency Medicine

## 2020-10-06 ENCOUNTER — Encounter: Payer: Self-pay | Admitting: Emergency Medicine

## 2020-10-06 DIAGNOSIS — I10 Essential (primary) hypertension: Secondary | ICD-10-CM | POA: Insufficient documentation

## 2020-10-06 DIAGNOSIS — Z7982 Long term (current) use of aspirin: Secondary | ICD-10-CM | POA: Diagnosis not present

## 2020-10-06 DIAGNOSIS — Z79899 Other long term (current) drug therapy: Secondary | ICD-10-CM | POA: Insufficient documentation

## 2020-10-06 DIAGNOSIS — D320 Benign neoplasm of cerebral meninges: Secondary | ICD-10-CM | POA: Insufficient documentation

## 2020-10-06 DIAGNOSIS — J45909 Unspecified asthma, uncomplicated: Secondary | ICD-10-CM | POA: Insufficient documentation

## 2020-10-06 DIAGNOSIS — I6782 Cerebral ischemia: Secondary | ICD-10-CM | POA: Diagnosis not present

## 2020-10-06 DIAGNOSIS — H8112 Benign paroxysmal vertigo, left ear: Secondary | ICD-10-CM | POA: Insufficient documentation

## 2020-10-06 DIAGNOSIS — Z20822 Contact with and (suspected) exposure to covid-19: Secondary | ICD-10-CM | POA: Insufficient documentation

## 2020-10-06 DIAGNOSIS — R42 Dizziness and giddiness: Secondary | ICD-10-CM | POA: Diagnosis present

## 2020-10-06 LAB — URINALYSIS, COMPLETE (UACMP) WITH MICROSCOPIC
Bacteria, UA: NONE SEEN
Bilirubin Urine: NEGATIVE
Glucose, UA: NEGATIVE mg/dL
Ketones, ur: NEGATIVE mg/dL
Leukocytes,Ua: NEGATIVE
Nitrite: NEGATIVE
Protein, ur: NEGATIVE mg/dL
Specific Gravity, Urine: 1.011 (ref 1.005–1.030)
pH: 7 (ref 5.0–8.0)

## 2020-10-06 LAB — CBC
HCT: 39.8 % (ref 36.0–46.0)
Hemoglobin: 12.7 g/dL (ref 12.0–15.0)
MCH: 26.5 pg (ref 26.0–34.0)
MCHC: 31.9 g/dL (ref 30.0–36.0)
MCV: 83.1 fL (ref 80.0–100.0)
Platelets: 404 10*3/uL — ABNORMAL HIGH (ref 150–400)
RBC: 4.79 MIL/uL (ref 3.87–5.11)
RDW: 13.7 % (ref 11.5–15.5)
WBC: 9 10*3/uL (ref 4.0–10.5)
nRBC: 0 % (ref 0.0–0.2)

## 2020-10-06 LAB — COMPREHENSIVE METABOLIC PANEL
ALT: 13 U/L (ref 0–44)
AST: 17 U/L (ref 15–41)
Albumin: 4 g/dL (ref 3.5–5.0)
Alkaline Phosphatase: 54 U/L (ref 38–126)
Anion gap: 9 (ref 5–15)
BUN: 11 mg/dL (ref 8–23)
CO2: 28 mmol/L (ref 22–32)
Calcium: 9.7 mg/dL (ref 8.9–10.3)
Chloride: 102 mmol/L (ref 98–111)
Creatinine, Ser: 0.71 mg/dL (ref 0.44–1.00)
GFR, Estimated: >60 mL/min (ref >60)
Glucose, Bld: 111 mg/dL — ABNORMAL HIGH (ref 70–99)
Potassium: 3.6 mmol/L (ref 3.5–5.1)
Sodium: 139 mmol/L (ref 135–145)
Total Bilirubin: 1 mg/dL (ref 0.3–1.2)
Total Protein: 7.6 g/dL (ref 6.5–8.1)

## 2020-10-06 LAB — LIPASE, BLOOD: Lipase: 21 U/L (ref 11–51)

## 2020-10-06 LAB — RESPIRATORY PANEL BY RT PCR (FLU A&B, COVID)
Influenza A by PCR: NEGATIVE
Influenza B by PCR: NEGATIVE
SARS Coronavirus 2 by RT PCR: NEGATIVE

## 2020-10-06 LAB — CBG MONITORING, ED: Glucose-Capillary: 96 mg/dL (ref 70–99)

## 2020-10-06 MED ORDER — MECLIZINE HCL 25 MG PO TABS
25.0000 mg | ORAL_TABLET | Freq: Once | ORAL | Status: AC
  Administered 2020-10-06: 25 mg via ORAL
  Filled 2020-10-06: qty 1

## 2020-10-06 MED ORDER — LORAZEPAM 2 MG/ML IJ SOLN: 1.0000 mg | INTRAMUSCULAR | Status: DC | PRN

## 2020-10-06 MED ORDER — LORAZEPAM 0.5 MG PO TABS
0.5000 mg | ORAL_TABLET | ORAL | Status: DC | PRN
  Administered 2020-10-06: 0.5 mg via ORAL
  Filled 2020-10-06: qty 1

## 2020-10-06 MED ORDER — MECLIZINE HCL 25 MG PO TABS: 25.0000 mg | ORAL_TABLET | Freq: Three times a day (TID) | ORAL | 0 refills | Status: AC | PRN

## 2020-10-06 NOTE — ED Provider Notes (Signed)
[facility] Emergency Department Provider Note ____________________________________________   First MD Initiated Contact with Patient 10/06/20 1000     (approximate)  I have reviewed the triage vital signs and the nursing notes.  HISTORY  Chief Complaint Dizziness and Nausea   HPI Selena Walker is a 64 y.o. femalewho presents to the ED for evaluation of dizziness and nausea.   Chart review indicates patient seen by me 2 months ago for isolated palpitations in the setting of hypokalemia.  History of HTN on chlorthalidone, HLD, GERD and depression.  No history of CAD.  Patient presents to the ED with her sister-in-law, who provides additional history. Patient reports a degree of intermittent positional vertigo at baseline she reports she has had the symptoms for the past week.  She reports developing similar dizziness last night about 8 PM, but has had persistent unremitting vertigo since then.  She reports associated nausea without vomiting, gait unsteadiness.  She denies any falls, head trauma or headache.  Denies fevers or recent illnesses.  She reports continued dizziness at this time that she reports is vertiginous.  Patient reports never having had persistent vertigo like this in the past.  Past Medical History:  Diagnosis Date  . Asthma   . Depression   . GERD (gastroesophageal reflux disease)   . Glaucoma   . History of echocardiogram    a. 12/2016 Echo: EF 60-65%.  . History of stress test    a. 04/2015 ETT: Ex time 3:12, Max HR 162, BP max 235/115. No ischemic changes.  . Hyperlipidemia   . Hypertension   . Left breast mass    a. 09/2017 CTA Chest: 2.5 cm Left retroareolar breast mass - rec outpt f/u.  Marland Kitchen Meningioma ([place])    a. 12/2016 MRI Brain: 81mm parafalcine meningioma, stable since 2013.  . Migraines   . Nodule of upper lobe of right lung    a. 09/2017 CTA chest: 5mm ground glass nodule @ apex of RUL (rec repeat in 6-12 mos).  . Obstructive  sleep apnea    a. She has undergone sleep study but has not yet been fitted w/ CPAP.  Marland Kitchen Palpitations    a. 12/2017 Event monitor:  No significant arrhythmias.  . Rib fractures    a. 09/2017 s/p fall w/ R posterior 8th-9th rib fx.  . Syncope    a. 09/2017 - ? etiology; b. 12/2017 Event monitor:  No significant arrhythmias.  Marland Kitchen TIA (transient ischemic attack)    a. ? TIA 12/2016 vs Migraine w/ Aura - followed by Neurology.    Patient Active Problem List   Diagnosis Date Noted  . Syncope 10/07/2017  . TIA (transient ischemic attack) 12/28/2016    Past Surgical History:  Procedure Laterality Date  . BREAST EXCISIONAL BIOPSY Right 1990   neg  . COLONOSCOPY  2007  . COLONOSCOPY WITH PROPOFOL N/A 02/19/2016   Procedure: COLONOSCOPY WITH PROPOFOL;  Surgeon: Manya Silvas, MD;  Location: [facility] ENDOSCOPY;  Service: Endoscopy;  Laterality: N/A;  . Lap TAHBSO  12/19/2014    Prior to Admission medications   Medication Sig Start Date End Date Taking? Authorizing Provider  albuterol (PROVENTIL HFA;VENTOLIN HFA) 108 (90 Base) MCG/ACT inhaler Inhale 2 puffs into the lungs every 4 (four) hours as needed for wheezing or shortness of breath.    [provider]  alum & mag hydroxide-simeth (MAALOX/MYLANTA) 200-200-20 MG/5ML suspension Take 5 mLs by mouth every 6 (six) hours as needed for indigestion or heartburn.  [provider]  aspirin EC 81 MG EC tablet Take 1 tablet (81 mg total) by mouth daily. 12/29/16   Hillary Bow, MD  cetirizine (ZYRTEC) 10 MG tablet Take 10 mg at bedtime by mouth.    [provider]  chlorthalidone (HYGROTON) 25 MG tablet Take 25 mg by mouth daily.    [provider]  dorzolamide-timolol (COSOPT) 22.3-6.8 MG/ML ophthalmic solution Place 1 drop into both eyes 2 (two) times daily. 04/15/16   [provider]  meclizine (ANTIVERT) 25 MG tablet Take 1 tablet (25 mg total) by mouth 3 (three) times daily as needed for dizziness. 10/06/20    Vladimir Crofts, MD    Allergies Duloxetine, Escitalopram oxalate, Gabapentin, Penicillins, Shellfish allergy, Ivp dye [iodinated diagnostic agents], Metrizamide, Other, and Penicillin v  Family History  Problem Relation Age of Onset  . Breast cancer Sister 66  . CAD Mother   . Diabetes Mother   . Hypertension Mother   . Migraines Mother   . Hypertension Father   . Stomach cancer Father   . CAD Father   . Colon polyps Father   . Cancer Brother   . Lung cancer Sister   . Diabetes Sister   . Multiple sclerosis Sister   . Hypertension Brother   . Hypertension Brother   . Hypertension Brother   . Hypertension Brother     Social History Social History   Tobacco Use  . Smoking status: Never Smoker  . Smokeless tobacco: Never Used  Vaping Use  . Vaping Use: Never used  Substance Use Topics  . Alcohol use: No  . Drug use: No    Review of Systems  Constitutional: No fever/chills Eyes: No visual changes. ENT: No sore throat. Cardiovascular: Denies chest pain. Respiratory: Denies shortness of breath. Gastrointestinal: No abdominal pain.  No nausea, no vomiting.  No diarrhea.  No constipation. Genitourinary: Negative for dysuria. Musculoskeletal: Negative for back pain. Skin: Negative for rash. Neurological: Negative for headaches, focal weakness or numbness.  Positive for vertigo and dizziness  ____________________________________________   PHYSICAL EXAM:  VITAL SIGNS: Vitals:   10/06/20 1415 10/06/20 1442  BP:  (!) 146/75  Pulse: 87 78  Resp: 16 13  Temp:    SpO2: 95% 99%     Constitutional: Alert and oriented. Upon presentation, very uncomfortable-appearing and even with my assistance is unable to get a bed due to her unsteadiness. Upon my reexamination of the patient, she looks much improved and is able to stand independently and ambulate with a normal gait. Eyes: Conjunctivae are normal. PERRL. EOMI. Head: Atraumatic. Nose: No  congestion/rhinnorhea. Mouth/Throat: Mucous membranes are moist.  Oropharynx non-erythematous. Neck: No stridor. No cervical spine tenderness to palpation. Cardiovascular: Normal rate, regular rhythm. Grossly normal heart sounds.  Good peripheral circulation. Respiratory: Normal respiratory effort.  No retractions. Lungs CTAB. Gastrointestinal: Soft , nondistended, nontender to palpation. No CVA tenderness. Musculoskeletal: No lower extremity tenderness nor edema.  No joint effusions. No signs of acute trauma. Neurologic:  Normal speech and language. No gross focal neurologic deficits are appreciated. No gait instability noted. Cranial nerves II through XII intact 5/5 strength and sensation in all 4 extremities Skin:  Skin is warm, dry and intact. No rash noted. Psychiatric: Mood and affect are normal. Speech and behavior are normal.  ____________________________________________   LABS (all labs ordered are listed, but only abnormal results are displayed)  Labs Reviewed  COMPREHENSIVE METABOLIC PANEL - Abnormal; Notable for the following components:  Result Value   Glucose, Bld 111 (*)    All other components within normal limits  CBC - Abnormal; Notable for the following components:   Platelets 404 (*)    All other components within normal limits  URINALYSIS, COMPLETE (UACMP) WITH MICROSCOPIC - Abnormal; Notable for the following components:   Color, Urine YELLOW (*)    APPearance HAZY (*)    Hgb urine dipstick SMALL (*)    All other components within normal limits  RESPIRATORY PANEL BY RT PCR (FLU A&B, COVID)  LIPASE, BLOOD  CBG MONITORING, ED   ____________________________________________  12 Lead EKG  Sinus rhythm, rate of 80 bpm.  Normal axis.  Normal intervals.  No evidence of acute ischemia. ____________________________________________  RADIOLOGY  ED MD interpretation: CT head reviewed by me without evidence of acute intracranial pathology.  Official radiology  report(s): CT Head Wo Contrast  Result Date: 10/06/2020 CLINICAL DATA:  Dizziness and nausea for several hours EXAM: CT HEAD WITHOUT CONTRAST TECHNIQUE: Contiguous axial images were obtained from the base of the skull through the vertex without intravenous contrast. COMPARISON:  10/06/2017 FINDINGS: Brain: No evidence of acute infarction, hemorrhage, hydrocephalus, extra-axial collection or mass lesion/mass effect. Vascular: No hyperdense vessel or unexpected calcification. Skull: Normal. Negative for fracture or focal lesion. Sinuses/Orbits: No acute finding. Other: None. IMPRESSION: No acute intracranial abnormality noted. Electronically Signed   By: Inez Catalina M.D.   On: 10/06/2020 12:30   MR BRAIN WO CONTRAST  Result Date: 10/06/2020 CLINICAL DATA:  Neuro deficit, dizziness.  Weakness. EXAM: MRI HEAD WITHOUT CONTRAST TECHNIQUE: Multiplanar, multiecho pulse sequences of the brain and surrounding structures were obtained without intravenous contrast. COMPARISON:  10/06/2020 head CT and prior. 12/29/2016 MRI head and prior. FINDINGS: Brain: No diffusion-weighted signal abnormality. Remote right frontal microhemorrhage. No midline shift, ventriculomegaly or extra-axial fluid collection. 6 mm posterior parafalcine meningioma is better demonstrated on prior exam. Cerebral volume is within normal limits. Mild chronic microvascular ischemic changes. Vascular: Normal flow voids. Skull and upper cervical spine: Normal marrow signal. Sinuses/Orbits: Normal orbits. Clear paranasal sinuses. No mastoid effusion. Other: None. IMPRESSION: No acute intracranial process. Remote right frontal microhemorrhage. Mild chronic microvascular ischemic changes. Small posterior parafalcine meningioma, better demonstrated on prior exam. Electronically Signed   By: Primitivo Gauze M.D.   On: 10/06/2020 13:26   ____________________________________________   PROCEDURES and INTERVENTIONS  Procedure(s) performed (including  Critical Care):  .1-3 Lead EKG Interpretation Performed by: Vladimir Crofts, MD Authorized by: Vladimir Crofts, MD     Interpretation: normal     ECG rate:  78   ECG rate assessment: normal     Rhythm: sinus rhythm     Ectopy: none     Conduction: normal      Medications  LORazepam (ATIVAN) tablet 0.5 mg (0.5 mg Oral Given 10/06/20 1207)  LORazepam (ATIVAN) injection 1 mg (has no administration in time range)  meclizine (ANTIVERT) tablet 25 mg (25 mg Oral Given 10/06/20 1207)    ____________________________________________   MDM / ED COURSE   64 year old woman presents to the ED with vertiginous dizziness, likely peripheral in nature, and amenable to outpatient management.  Normal vital signs on room air.  Exam is initially quite concerning with the degree of her dizziness, and therefore sent this is constant and new for her.  She has no evidence of peripheral focal deficits or cranial nerve deficits though.  Patient not feeling better shortly after meclizine administration, so we agreed upon MRI brain to assess for posterior  stroke in the setting of her new and constant dizziness.  Thankfully, this does not show evidence of such.  She has a known meningioma without significant increase in size.  After MRI, she reports resolution of symptoms and feels much better.  Provide meclizine prescription as an outpatient and discussed return precautions for the ED.  Patient medically stable for discharge home.  Clinical Course as of Oct 06 1542  Fri Oct 06, 2020  1416 Reassessed.  Patient reports resolution of dizziness and feels better.  Educated patient on reassuring MRI without evidence of CVA.  We discussed BPPV as likely etiology of her symptoms.  We discussed meclizine as an outpatient.   [DS]    Clinical Course User Index [DS] Vladimir Crofts, MD    ____________________________________________   FINAL CLINICAL IMPRESSION(S) / ED DIAGNOSES  Final diagnoses:  Dizziness  Benign  paroxysmal positional vertigo of left ear     ED Discharge Orders         Ordered    meclizine (ANTIVERT) 25 MG tablet  3 times daily PRN        10/06/20 1432           Tomicka Lover   Note:  This document was prepared using Dragon voice recognition software and may include unintentional dictation errors.   Vladimir Crofts, MD 10/06/20 [phone]

## 2020-10-06 NOTE — Discharge Instructions (Signed)
You were seen in the ED because of your dizziness.  Thankfully, there is no signs of stroke on her MRI and you feel better after getting meclizine.  This is a medicine that you can take at home on an as-needed basis for your dizziness.  You do not have to take it every day.  You can take it up to 3 times a day.  It is safe to take with your other medications, it is safe to drive and work with this medication.  Please follow-up with your primary care physician to discuss your symptoms.  Like we talked about, if you develop any significantly worsening dizziness that will not go away, other strokelike symptoms, please return to the ED immediately.

## 2020-10-06 NOTE — ED Triage Notes (Signed)
Pt presents to ED via POV with c/o dizziness and nausea that started and approx 2100 yesterday. Pt presents A&O x4. Pt states hx of same symptoms, "but not quite as bad". Pt denies pain at this time, denies vomiting, c/o nausea only.

## 2020-10-23 ENCOUNTER — Ambulatory Visit
Admission: RE | Admit: 2020-10-23 | Discharge: 2020-10-23 | Disposition: A | Discharge status: Home / Self Care | Payer: Medicare HMO | Source: Ambulatory Visit | Attending: Obstetrics and Gynecology | Admitting: Obstetrics and Gynecology

## 2020-10-23 ENCOUNTER — Other Ambulatory Visit: Payer: Self-pay

## 2020-10-23 DIAGNOSIS — Z780 Asymptomatic menopausal state: Secondary | ICD-10-CM

## 2020-10-23 DIAGNOSIS — Z1231 Encounter for screening mammogram for malignant neoplasm of breast: Secondary | ICD-10-CM | POA: Insufficient documentation

## 2020-10-23 DIAGNOSIS — Z0000 Encounter for general adult medical examination without abnormal findings: Secondary | ICD-10-CM | POA: Diagnosis present

## 2020-12-28 ENCOUNTER — Other Ambulatory Visit: Payer: Self-pay

## 2020-12-28 ENCOUNTER — Encounter: Payer: Self-pay | Admitting: Emergency Medicine

## 2020-12-28 ENCOUNTER — Ambulatory Visit
Admission: EM | Admit: 2020-12-28 | Discharge: 2020-12-28 | Disposition: A | Discharge status: Home / Self Care | Payer: Medicare HMO | Attending: Emergency Medicine | Admitting: Emergency Medicine

## 2020-12-28 DIAGNOSIS — J069 Acute upper respiratory infection, unspecified: Secondary | ICD-10-CM

## 2020-12-28 DIAGNOSIS — R197 Diarrhea, unspecified: Secondary | ICD-10-CM

## 2020-12-28 DIAGNOSIS — Z1152 Encounter for screening for COVID-19: Secondary | ICD-10-CM

## 2020-12-28 DIAGNOSIS — R6883 Chills (without fever): Secondary | ICD-10-CM

## 2020-12-28 MED ORDER — FLUTICASONE PROPIONATE 50 MCG/ACT NA SUSP: 1.0000 | Freq: Every day | NASAL | 0 refills | Status: AC

## 2020-12-28 MED ORDER — DEXAMETHASONE 4 MG PO TABS: 4.0000 mg | ORAL_TABLET | Freq: Every day | ORAL | 0 refills | Status: AC

## 2020-12-28 MED ORDER — BENZONATATE 100 MG PO CAPS: 100.0000 mg | ORAL_CAPSULE | Freq: Three times a day (TID) | ORAL | 0 refills | Status: DC | PRN

## 2020-12-28 MED ORDER — CETIRIZINE HCL 10 MG PO TABS: 10.0000 mg | ORAL_TABLET | Freq: Every day | ORAL | 0 refills | Status: AC

## 2020-12-28 NOTE — ED Provider Notes (Signed)
[place]   [id number] 12/28/20 Arrival Time: 6283   CC: COVID symptoms  SUBJECTIVE: History from: patient.  Selena Walker is a 65 y.o. female who presented to the urgent care with a complaint of chills, nasal congestion, cough and diarrhea for the past 2 days.  Denies sick exposure to COVID, flu or strep.  Denies recent travel.  Has tried OTC medication relief.  Denies alleviating or aggravating factors.  Denies previous symptoms in the past.   Denies fever, chills, fatigue, sinus pain, rhinorrhea, sore throat, SOB, wheezing, chest pain, nausea, changes in bowel or bladder habits.    ROS: As per HPI.  All other pertinent ROS negative.      Past Medical History:  Diagnosis Date   Asthma    Depression    GERD (gastroesophageal reflux disease)    Glaucoma    History of echocardiogram    a. 12/2016 Echo: EF 60-65%.   History of stress test    a. 04/2015 ETT: Ex time 3:12, Max HR 162, BP max 235/115. No ischemic changes.   Hyperlipidemia    Hypertension    Left breast mass    a. 09/2017 CTA Chest: 2.5 cm Left retroareolar breast mass - rec outpt f/u.   Meningioma ([place])    a. 12/2016 MRI Brain: 14mm parafalcine meningioma, stable since 2013.   Migraines    Nodule of upper lobe of right lung    a. 09/2017 CTA chest: 47mm ground glass nodule @ apex of RUL (rec repeat in 6-12 mos).   Obstructive sleep apnea    a. She has undergone sleep study but has not yet been fitted w/ CPAP.   Palpitations    a. 12/2017 Event monitor:  No significant arrhythmias.   Rib fractures    a. 09/2017 s/p fall w/ R posterior 8th-9th rib fx.   Syncope    a. 09/2017 - ? etiology; b. 12/2017 Event monitor:  No significant arrhythmias.   TIA (transient ischemic attack)    a. ? TIA 12/2016 vs Migraine w/ Aura - followed by Neurology.   Past Surgical History:  Procedure Laterality Date   BREAST EXCISIONAL BIOPSY Right 1990   neg   COLONOSCOPY  2007   COLONOSCOPY WITH PROPOFOL  N/A 02/19/2016   Procedure: COLONOSCOPY WITH PROPOFOL;  Surgeon: Manya Silvas, MD;  Location: [facility] ENDOSCOPY;  Service: Endoscopy;  Laterality: N/A;   Lap TAHBSO  12/19/2014   Allergies  Allergen Reactions   Duloxetine     Other reaction(s): Unknown Other reaction(s): ? Other reaction(s): ?    Escitalopram Oxalate     Other reaction(s): Unknown Other reaction(s): hives Other reaction(s): hives    Gabapentin     Other reaction(s): Unknown Other reaction(s): mental status changes Other reaction(s): mental status changes    Penicillins Hives    Other reaction(s): rash Other reaction(s): rash    Shellfish Allergy Rash    Other reaction(s): hives Other reaction(s): hives    Ivp Dye [Iodinated Diagnostic Agents] Rash   Metrizamide Rash   Other Rash    IV Contrast Medium   Penicillin V Rash   No current facility-administered medications on file prior to encounter.   Current Outpatient Medications on File Prior to Encounter  Medication Sig Dispense Refill   albuterol (PROVENTIL HFA;VENTOLIN HFA) 108 (90 Base) MCG/ACT inhaler Inhale 2 puffs into the lungs every 4 (four) hours as needed for wheezing or shortness of breath.     alum & mag hydroxide-simeth (MAALOX/MYLANTA) 200-200-20  MG/5ML suspension Take 5 mLs by mouth every 6 (six) hours as needed for indigestion or heartburn.     aspirin EC 81 MG EC tablet Take 1 tablet (81 mg total) by mouth daily.     chlorthalidone (HYGROTON) 25 MG tablet Take 25 mg by mouth daily.     dorzolamide-timolol (COSOPT) 22.3-6.8 MG/ML ophthalmic solution Place 1 drop into both eyes 2 (two) times daily.     meclizine (ANTIVERT) 25 MG tablet Take 1 tablet (25 mg total) by mouth 3 (three) times daily as needed for dizziness. 30 tablet 0   Social History   Socioeconomic History   Marital status: Widowed    Spouse name: Not on file   Number of children: Not on file   Years of education: Not on file   Highest education  level: Not on file  Occupational History   Not on file  Tobacco Use   Smoking status: Never Smoker   Smokeless tobacco: Never Used  Vaping Use   Vaping Use: Never used  Substance and Sexual Activity   Alcohol use: No   Drug use: No   Sexual activity: Never  Other Topics Concern   Not on file  Social History Narrative   Lives in [place] by herself.  Three grown sons - all married and out of the house.  Walks most days of the week.   Social Determinants of Health   Financial Resource Strain: Not on file  Food Insecurity: Not on file  Transportation Needs: Not on file  Physical Activity: Not on file  Stress: Not on file  Social Connections: Not on file  Intimate Partner Violence: Not on file   Family History  Problem Relation Age of Onset   Breast cancer Sister 19   CAD Mother    Diabetes Mother    Hypertension Mother    Migraines Mother    Hypertension Father    Stomach cancer Father    CAD Father    Colon polyps Father    Cancer Brother    Lung cancer Sister    Diabetes Sister    Multiple sclerosis Sister    Hypertension Brother    Hypertension Brother    Hypertension Brother    Hypertension Brother     OBJECTIVE:  Vitals:   12/28/20 1126 12/28/20 1127  BP:  129/75  Pulse:  (!) 113  Resp:  19  Temp:  98.3 F (36.8 C)  TempSrc:  Oral  SpO2:  94%  Weight: 220 lb (99.8 kg)   Height: 5\' 6"  (1.676 m)      General appearance: alert; appears fatigued, but nontoxic; speaking in full sentences and tolerating own secretions HEENT: NCAT; Ears: EACs clear, TMs pearly gray; Eyes: PERRL.  EOM grossly intact. Sinuses: nontender; Nose: nares patent without rhinorrhea, Throat: oropharynx clear, tonsils non erythematous or enlarged, uvula midline  Neck: supple without LAD Lungs: unlabored respirations, symmetrical air entry; cough: moderate; no respiratory distress; CTAB Heart: regular rate and rhythm.  Radial pulses 2+ symmetrical  bilaterally Skin: warm and dry Psychological: alert and cooperative; normal mood and affect  LABS:  No results found for this or any previous visit (from the past 24 hour(s)).   ASSESSMENT & PLAN:  1. Encounter for screening for COVID-19   2. URI with cough and congestion   3. Chills   4. Diarrhea, unspecified type     Meds ordered this encounter  Medications   benzonatate (TESSALON) 100 MG capsule    Sig:  Take 1 capsule (100 mg total) by mouth 3 (three) times daily as needed for cough.    Dispense:  30 capsule    Refill:  0   fluticasone (FLONASE) 50 MCG/ACT nasal spray    Sig: Place 1 spray into both nostrils daily for 14 days.    Dispense:  16 g    Refill:  0   cetirizine (ZYRTEC ALLERGY) 10 MG tablet    Sig: Take 1 tablet (10 mg total) by mouth daily.    Dispense:  30 tablet    Refill:  0   dexamethasone (DECADRON) 4 MG tablet    Sig: Take 1 tablet (4 mg total) by mouth daily for 7 days.    Dispense:  7 tablet    Refill:  0    Discharge Instructions  COVID testing ordered.  It will take between 2-7 days for test results.  Someone will contact you regarding abnormal results.    Get plenty of rest and push fluids Tessalon Perles prescribed for cough Zyrtec for nasal congestion, runny nose, and/or sore throat Flonase for nasal congestion and runny nose Decadron was prescribed Use medications daily for symptom relief Use OTC medications like ibuprofen or tylenol as needed fever or pain Call or go to the ED if you have any new or worsening symptoms such as fever, worsening cough, shortness of breath, chest tightness, chest pain, turning blue, changes in mental status, etc...   Reviewed expectations re: course of current medical issues. Questions answered. Outlined signs and symptoms indicating need for more acute intervention. Patient verbalized understanding. After Visit Summary given.         Emerson Monte, FNP 12/28/20 1150

## 2020-12-28 NOTE — Discharge Instructions (Addendum)
COVID testing ordered.  It will take between 2-7 days for test results.  Someone will contact you regarding abnormal results.    Get plenty of rest and push fluids Tessalon Perles prescribed for cough Zyrtec for nasal congestion, runny nose, and/or sore throat Flonase for nasal congestion and runny nose Decadron was prescribed Use medications daily for symptom relief Use OTC medications like ibuprofen or tylenol as needed fever or pain Call or go to the ED if you have any new or worsening symptoms such as fever, worsening cough, shortness of breath, chest tightness, chest pain, turning blue, changes in mental status, etc...  

## 2020-12-28 NOTE — ED Triage Notes (Signed)
Congestion, diarrhea, coughing up phlegm, chills x 2 days

## 2020-12-29 LAB — COVID-19, FLU A+B NAA
Influenza A, NAA: NOT DETECTED
Influenza B, NAA: NOT DETECTED
SARS-CoV-2, NAA: DETECTED — AB

## 2021-01-11 ENCOUNTER — Emergency Department (HOSPITAL_COMMUNITY)
Admission: EM | Admit: 2021-01-11 | Discharge: 2021-01-11 | Disposition: A | Discharge status: Home / Self Care | Payer: Medicare HMO | Attending: Emergency Medicine | Admitting: Emergency Medicine

## 2021-01-11 ENCOUNTER — Other Ambulatory Visit: Payer: Self-pay

## 2021-01-11 ENCOUNTER — Encounter (HOSPITAL_COMMUNITY): Payer: Self-pay | Admitting: *Deleted

## 2021-01-11 ENCOUNTER — Emergency Department (HOSPITAL_COMMUNITY): Payer: Medicare HMO

## 2021-01-11 DIAGNOSIS — Z8673 Personal history of transient ischemic attack (TIA), and cerebral infarction without residual deficits: Secondary | ICD-10-CM | POA: Insufficient documentation

## 2021-01-11 DIAGNOSIS — J45909 Unspecified asthma, uncomplicated: Secondary | ICD-10-CM | POA: Diagnosis not present

## 2021-01-11 DIAGNOSIS — U071 COVID-19: Secondary | ICD-10-CM | POA: Diagnosis not present

## 2021-01-11 DIAGNOSIS — R0602 Shortness of breath: Secondary | ICD-10-CM | POA: Diagnosis present

## 2021-01-11 DIAGNOSIS — Z79899 Other long term (current) drug therapy: Secondary | ICD-10-CM | POA: Insufficient documentation

## 2021-01-11 DIAGNOSIS — I10 Essential (primary) hypertension: Secondary | ICD-10-CM | POA: Diagnosis not present

## 2021-01-11 DIAGNOSIS — Z7982 Long term (current) use of aspirin: Secondary | ICD-10-CM | POA: Diagnosis not present

## 2021-01-11 LAB — COMPREHENSIVE METABOLIC PANEL
ALT: 13 U/L (ref 0–44)
AST: 16 U/L (ref 15–41)
Albumin: 3.8 g/dL (ref 3.5–5.0)
Alkaline Phosphatase: 56 U/L (ref 38–126)
Anion gap: 8 (ref 5–15)
BUN: 10 mg/dL (ref 8–23)
CO2: 27 mmol/L (ref 22–32)
Calcium: 9.6 mg/dL (ref 8.9–10.3)
Chloride: 101 mmol/L (ref 98–111)
Creatinine, Ser: 0.88 mg/dL (ref 0.44–1.00)
GFR, Estimated: >60 mL/min (ref >60)
Glucose, Bld: 109 mg/dL — ABNORMAL HIGH (ref 70–99)
Potassium: 3.3 mmol/L — ABNORMAL LOW (ref 3.5–5.1)
Sodium: 136 mmol/L (ref 135–145)
Total Bilirubin: 0.7 mg/dL (ref 0.3–1.2)
Total Protein: 7.4 g/dL (ref 6.5–8.1)

## 2021-01-11 LAB — CBC WITH DIFFERENTIAL/PLATELET
Abs Immature Granulocytes: 0.08 10*3/uL — ABNORMAL HIGH (ref 0.00–0.07)
Basophils Absolute: 0 10*3/uL (ref 0.0–0.1)
Basophils Relative: 0 %
Eosinophils Absolute: 0.1 10*3/uL (ref 0.0–0.5)
Eosinophils Relative: 1 %
HCT: 38.9 % (ref 36.0–46.0)
Hemoglobin: 12 g/dL (ref 12.0–15.0)
Immature Granulocytes: 1 %
Lymphocytes Relative: 24 %
Lymphs Abs: 3.2 10*3/uL (ref 0.7–4.0)
MCH: 26.3 pg (ref 26.0–34.0)
MCHC: 30.8 g/dL (ref 30.0–36.0)
MCV: 85.1 fL (ref 80.0–100.0)
Monocytes Absolute: 0.9 10*3/uL (ref 0.1–1.0)
Monocytes Relative: 7 %
Neutro Abs: 8.8 10*3/uL — ABNORMAL HIGH (ref 1.7–7.7)
Neutrophils Relative %: 67 %
Platelets: 455 10*3/uL — ABNORMAL HIGH (ref 150–400)
RBC: 4.57 MIL/uL (ref 3.87–5.11)
RDW: 14.5 % (ref 11.5–15.5)
WBC: 13.1 10*3/uL — ABNORMAL HIGH (ref 4.0–10.5)
nRBC: 0 % (ref 0.0–0.2)

## 2021-01-11 LAB — LIPASE, BLOOD: Lipase: 28 U/L (ref 11–51)

## 2021-01-11 LAB — MAGNESIUM: Magnesium: 2 mg/dL (ref 1.7–2.4)

## 2021-01-11 MED ORDER — POTASSIUM CHLORIDE CRYS ER 20 MEQ PO TBCR
20.0000 meq | EXTENDED_RELEASE_TABLET | Freq: Once | ORAL | Status: AC
  Administered 2021-01-11: 20 meq via ORAL
  Filled 2021-01-11: qty 1

## 2021-01-11 MED ORDER — POTASSIUM CHLORIDE CRYS ER 20 MEQ PO TBCR: 20.0000 meq | EXTENDED_RELEASE_TABLET | Freq: Two times a day (BID) | ORAL | 0 refills | Status: DC

## 2021-01-11 NOTE — ED Notes (Signed)
Given Spacer/aerochamber and given instructions on use.  Pt verbally understands.

## 2021-01-11 NOTE — ED Provider Notes (Signed)
[facility] EMERGENCY DEPARTMENT Provider Note   CSN: [id number] Arrival date & time: 01/11/21  1119     History Chief Complaint  Patient presents with  . Shortness of Breath    Selena Walker is a 65 y.o. female who presents via urgent care referral for chest x-ray.  Patient tested positive for COVID-19 on 12/27/2020 and has been experiencing intermittent shortness of breath, productive cough, and diarrhea since that time.  She states that she presented to urgent care this morning for repeat chest x-ray given her persistent cough and intermittent left-sided chest tightness.  Unfortunately their x-ray machine was out of service and so they referred her to the emergency department.  She denies any chest pain, shortness of breath at this time, palpitations, abdominal pain, nausea, vomiting.  She does endorse approximately 4 episodes of loose stool daily since she was diagnosed with COVID-19.  Denies melena or hematochezia.  Denies dysuria, urinary frequency or urgency.  She states that she has been quite thirsty, drinking lots of fluids, however feels that she is unable to keep up with her diarrhea.  She is very well-appearing.  Patient was not vaccinated as COVID-19.  I have personally reviewed this patient's medical records.  She is history of TIA, hypertension, glaucoma, GERD, asthma, depression, hyperlipidemia, obstructive sleep apnea.  She is on aspirin, but is not on any other anticoagulation.  HPI     Past Medical History:  Diagnosis Date  . Asthma   . Depression   . GERD (gastroesophageal reflux disease)   . Glaucoma   . History of echocardiogram    a. 12/2016 Echo: EF 60-65%.  . History of stress test    a. 04/2015 ETT: Ex time 3:12, Max HR 162, BP max 235/115. No ischemic changes.  . Hyperlipidemia   . Hypertension   . Left breast mass    a. 09/2017 CTA Chest: 2.5 cm Left retroareolar breast mass - rec outpt f/u.  Marland Kitchen Meningioma ([place])    a. 12/2016 MRI Brain: 34mm parafalcine  meningioma, stable since 2013.  . Migraines   . Nodule of upper lobe of right lung    a. 09/2017 CTA chest: 32mm ground glass nodule @ apex of RUL (rec repeat in 6-12 mos).  . Obstructive sleep apnea    a. She has undergone sleep study but has not yet been fitted w/ CPAP.  Marland Kitchen Palpitations    a. 12/2017 Event monitor:  No significant arrhythmias.  . Rib fractures    a. 09/2017 s/p fall w/ R posterior 8th-9th rib fx.  . Syncope    a. 09/2017 - ? etiology; b. 12/2017 Event monitor:  No significant arrhythmias.  Marland Kitchen TIA (transient ischemic attack)    a. ? TIA 12/2016 vs Migraine w/ Aura - followed by Neurology.    Patient Active Problem List   Diagnosis Date Noted  . Syncope 10/07/2017  . TIA (transient ischemic attack) 12/28/2016    Past Surgical History:  Procedure Laterality Date  . BREAST EXCISIONAL BIOPSY Right 1990   neg  . COLONOSCOPY  2007  . COLONOSCOPY WITH PROPOFOL N/A 02/19/2016   Procedure: COLONOSCOPY WITH PROPOFOL;  Surgeon: Manya Silvas, MD;  Location: [facility] ENDOSCOPY;  Service: Endoscopy;  Laterality: N/A;  . Lap TAHBSO  12/19/2014     OB History   No obstetric history on file.     Family History  Problem Relation Age of Onset  . Breast cancer Sister 13  . CAD Mother   .  Diabetes Mother   . Hypertension Mother   . Migraines Mother   . Hypertension Father   . Stomach cancer Father   . CAD Father   . Colon polyps Father   . Cancer Brother   . Lung cancer Sister   . Diabetes Sister   . Multiple sclerosis Sister   . Hypertension Brother   . Hypertension Brother   . Hypertension Brother   . Hypertension Brother     Social History   Tobacco Use  . Smoking status: Never Smoker  . Smokeless tobacco: Never Used  Vaping Use  . Vaping Use: Never used  Substance Use Topics  . Alcohol use: No  . Drug use: No    Home Medications Prior to Admission medications   Medication Sig Start Date End Date Taking? Authorizing Provider  potassium chloride SA  (KLOR-CON) 20 MEQ tablet Take 1 tablet (20 mEq total) by mouth 2 (two) times daily for 2 days. 01/11/21 01/13/21 Yes Sheyna Pettibone R, PA-C  albuterol (PROVENTIL HFA;VENTOLIN HFA) 108 (90 Base) MCG/ACT inhaler Inhale 2 puffs into the lungs every 4 (four) hours as needed for wheezing or shortness of breath.    [provider]  alum & mag hydroxide-simeth (MAALOX/MYLANTA) 200-200-20 MG/5ML suspension Take 5 mLs by mouth every 6 (six) hours as needed for indigestion or heartburn.    [provider]  aspirin EC 81 MG EC tablet Take 1 tablet (81 mg total) by mouth daily. 12/29/16   Hillary Bow, MD  benzonatate (TESSALON) 100 MG capsule Take 1 capsule (100 mg total) by mouth 3 (three) times daily as needed for cough. 12/28/20   Avegno, Darrelyn Hillock, FNP  cetirizine (ZYRTEC ALLERGY) 10 MG tablet Take 1 tablet (10 mg total) by mouth daily. 12/28/20   Avegno, Darrelyn Hillock, FNP  chlorthalidone (HYGROTON) 25 MG tablet Take 25 mg by mouth daily.    [provider]  dorzolamide-timolol (COSOPT) 22.3-6.8 MG/ML ophthalmic solution Place 1 drop into both eyes 2 (two) times daily. 04/15/16   [provider]  fluticasone (FLONASE) 50 MCG/ACT nasal spray Place 1 spray into both nostrils daily for 14 days. 12/28/20 01/11/21  Avegno, Darrelyn Hillock, FNP  meclizine (ANTIVERT) 25 MG tablet Take 1 tablet (25 mg total) by mouth 3 (three) times daily as needed for dizziness. 10/06/20   Vladimir Crofts, MD    Allergies    Duloxetine, Escitalopram oxalate, Gabapentin, Penicillins, Shellfish allergy, Ivp dye [iodinated diagnostic agents], Metrizamide, Other, and Penicillin v  Review of Systems   Review of Systems  Constitutional: Positive for activity change and fatigue. Negative for appetite change, chills, diaphoresis and fever.  HENT: Positive for congestion. Negative for sore throat, trouble swallowing and voice change.   Eyes: Negative.   Respiratory: Positive for cough and chest tightness. Negative  for shortness of breath.   Cardiovascular: Negative for chest pain, palpitations and leg swelling.  Gastrointestinal: Positive for diarrhea. Negative for abdominal pain, blood in stool, constipation, nausea and vomiting.  Genitourinary: Negative.  Negative for decreased urine volume, dysuria, frequency and urgency.  Musculoskeletal: Negative.   Skin: Negative.   Neurological: Negative for dizziness, syncope, facial asymmetry, weakness, light-headedness and headaches.    Physical Exam Updated Vital Signs BP (!) 149/65 (BP Location: Right Arm)   Pulse 89   Temp (!) 97.5 F (36.4 C) (Oral)   Resp 18   SpO2 100%   Physical Exam Vitals and nursing note reviewed.  Constitutional:      Appearance: She is  obese.  HENT:     Head: Normocephalic and atraumatic.     Nose: Congestion present.     Mouth/Throat:     Mouth: Mucous membranes are moist.     Pharynx: Oropharynx is clear. Uvula midline. No oropharyngeal exudate or posterior oropharyngeal erythema.     Tonsils: No tonsillar exudate.  Eyes:     General: Lids are normal. Vision grossly intact.        Right eye: No discharge.        Left eye: No discharge.     Extraocular Movements: Extraocular movements intact.     Conjunctiva/sclera: Conjunctivae normal.     Pupils: Pupils are equal, round, and reactive to light.  Neck:     Trachea: Trachea and phonation normal.  Cardiovascular:     Rate and Rhythm: Normal rate and regular rhythm.     Pulses: Normal pulses.          Radial pulses are 2+ on the right side and 2+ on the left side.       Dorsalis pedis pulses are 2+ on the right side and 2+ on the left side.     Heart sounds: Normal heart sounds. No murmur heard.   Pulmonary:     Effort: Pulmonary effort is normal. No tachypnea, accessory muscle usage, prolonged expiration or respiratory distress.     Breath sounds: Normal breath sounds. No wheezing or rales.  Chest:     Chest wall: No lacerations, deformity, swelling,  tenderness or crepitus.  Abdominal:     General: Bowel sounds are normal. There is no distension.     Palpations: Abdomen is soft.     Tenderness: There is no abdominal tenderness. There is no guarding or rebound.  Musculoskeletal:        General: No deformity.     Cervical back: Full passive range of motion without pain, normal range of motion and neck supple. No crepitus. No pain with movement, spinous process tenderness or muscular tenderness.     Right lower leg: No tenderness. No edema.     Left lower leg: No tenderness. No edema.  Lymphadenopathy:     Cervical: No cervical adenopathy.  Skin:    General: Skin is warm and dry.     Capillary Refill: Capillary refill takes less than 2 seconds.  Neurological:     General: No focal deficit present.     Mental Status: She is alert and oriented to person, place, and time. Mental status is at baseline.  Psychiatric:        Mood and Affect: Mood normal.     ED Results / Procedures / Treatments   Labs (all labs ordered are listed, but only abnormal results are displayed) Labs Reviewed  CBC WITH DIFFERENTIAL/PLATELET - Abnormal; Notable for the following components:      Result Value   WBC 13.1 (*)    Platelets 455 (*)    Neutro Abs 8.8 (*)    Abs Immature Granulocytes 0.08 (*)    All other components within normal limits  COMPREHENSIVE METABOLIC PANEL - Abnormal; Notable for the following components:   Potassium 3.3 (*)    Glucose, Bld 109 (*)    All other components within normal limits  LIPASE, BLOOD  MAGNESIUM    EKG EKG Interpretation  Date/Time:  Thursday January 11 2021 11:25:02 EST Ventricular Rate:  95 PR Interval:  168 QRS Duration: 72 QT Interval:  338 QTC Calculation: 424 R Axis:   -13 Text  Interpretation: Normal sinus rhythm Minimal voltage criteria for LVH, may be normal variant ( R in aVL ) Anterolateral infarct , age undetermined Abnormal ECG since last tracing no significant change Confirmed by Daleen Bo [phone]) on 01/11/2021 1:43:27 PM   Radiology DG Chest 2 View  Result Date: 01/11/2021 CLINICAL DATA:  Shortness of breath. EXAM: CHEST - 2 VIEW COMPARISON:  July 29, 2020. FINDINGS: The heart size and mediastinal contours are within normal limits. Both lungs are clear. No pneumothorax or pleural effusion is noted. The visualized skeletal structures are unremarkable. IMPRESSION: No active cardiopulmonary disease. Electronically Signed   By: Marijo Conception M.D.   On: 01/11/2021 12:20    Procedures Procedures   Medications Ordered in ED Medications  potassium chloride SA (KLOR-CON) CR tablet 20 mEq (20 mEq Oral Given 01/11/21 1518)    ED Course  I have reviewed the triage vital signs and the nursing notes.  Pertinent labs & imaging results that were available during my care of the patient were reviewed by me and considered in my medical decision making (see chart for details).    MDM Rules/Calculators/A&P                         65 year old female is 2 weeks status post COVID-19 infection presents today with concern for persistent diarrhea and productive cough.  Hypertensive on intake to 146/73.  Mildly tachypneic with respiratory rate of 20.  Vital signs otherwise normal.  Oxygen saturation 97% on room air.  Cardiopulmonary exam is reassuring, abdominal exam is benign.  Patient is neurovascularly intact in all 4 extremities.  Will check basic laboratory studies to rule out electrolyte derangement, chest x-ray, EKG.  Chest x-ray negative for acute cardiopulmonary disease, EKG with sinus rhythm.  Ambulatory O2 maintained oxygen saturations 93% or greater on room air.  CBC with mild leukocytosis of 13.1, CMP revealed hypokalemia to 3.3.  Magnesium is normal, lipase is normal.  Given reassuring physical exam and vital signs, laboratory studies, no further work-up is warranted in the ED at this time.  Suspect patient symptoms are secondary to residual COVID-19 infection.  Recommend  increased hydration. Will prescribe short course of potassium supplementation at home given mild hypokalemia in the emergency department today.  Nollie voiced understanding of her medical evaluation and treatment plan.  Each of her questions was answered to her expressed satisfaction.  Return precautions given.  Patient is well-appearing, stable, and appropriate for discharge at this time.  Recommend close follow-up with your primary care doctor  Selena Walker was evaluated in Emergency Department on 01/11/2021 for the symptoms described in the history of present illness. She was evaluated in the context of the global COVID-19 pandemic, which necessitated consideration that the patient might be at risk for infection with the SARS-CoV-2 virus that causes COVID-19. Institutional protocols and algorithms that pertain to the evaluation of patients at risk for COVID-19 are in a state of rapid change based on information released by regulatory bodies including the CDC and federal and state organizations. These policies and algorithms were followed during the patient's care in the ED.  This chart was dictated using voice recognition software, Dragon. Despite the best efforts of this provider to proofread and correct errors, errors may still occur which can change documentation meaning.   Final Clinical Impression(s) / ED Diagnoses Final diagnoses:  [id number]    Rx / DC Orders ED Discharge Orders  Ordered    potassium chloride SA (KLOR-CON) 20 MEQ tablet  2 times daily        01/11/21 1415           Josey Forcier, Gypsy Balsam, PA-C 01/11/21 1540    Daleen Bo, MD 01/12/21 1039

## 2021-01-11 NOTE — ED Triage Notes (Signed)
C/o shortness of breath for 3 days

## 2021-01-11 NOTE — ED Notes (Signed)
Patients o2 while walking down hall was 93%

## 2021-01-11 NOTE — ED Provider Notes (Signed)
  Face-to-face evaluation   History: She presents for evaluation of shortness of breath for 3 days.  She is recovering from a Covid infection, which began 2 weeks ago.  She complains of shortness of breath with mild exertional dyspnea.  She denies persistent productive cough but occasionally does spit out some clear fluid when she expectorates.  She denies diarrhea or vomiting.  No chest pain, weakness or dizziness.  There are no other known modifying factors  Physical exam: Elderly alert female who appears comfortable.  No respiratory distress.  No dysarthria or aphasia.  Medical screening examination/treatment/procedure(s) were conducted as a shared visit with non-physician practitioner(s) and myself.  I personally evaluated the patient during the encounter    Daleen Bo, MD 01/12/21 1039

## 2021-01-11 NOTE — Discharge Instructions (Addendum)
You were evaluated in the emergency room today for your persistent cough and diarrhea following your diagnosis of COVID-19.  Your physical exam, vital signs, and blood work were very reassuring.  You are not dehydrated, however your potassium is mildly low.  You have been prescribed 3 days of potassium supplementation to take at home.  Please continue to drink plenty of fluids.  You may take Imodium as needed at home for your diarrhea.  Recommend close follow-up with your primary care doctor.    Return to the emergency department if you develop any chest pain, worsening difficulty breathing, palpitations, nausea or vomiting that does not stop, or any other new severe symptoms.

## 2021-12-04 ENCOUNTER — Other Ambulatory Visit: Payer: Self-pay

## 2021-12-04 ENCOUNTER — Emergency Department
Admission: EM | Admit: 2021-12-04 | Discharge: 2021-12-05 | Disposition: A | Discharge status: Home / Self Care | Payer: Medicare HMO | Attending: Student in an Organized Health Care Education/Training Program | Admitting: Student in an Organized Health Care Education/Training Program

## 2021-12-04 ENCOUNTER — Encounter: Payer: Self-pay | Admitting: Emergency Medicine

## 2021-12-04 ENCOUNTER — Emergency Department: Payer: Medicare HMO

## 2021-12-04 DIAGNOSIS — J029 Acute pharyngitis, unspecified: Secondary | ICD-10-CM

## 2021-12-04 DIAGNOSIS — R002 Palpitations: Secondary | ICD-10-CM | POA: Diagnosis not present

## 2021-12-04 DIAGNOSIS — Z20822 Contact with and (suspected) exposure to covid-19: Secondary | ICD-10-CM | POA: Insufficient documentation

## 2021-12-04 LAB — CBC
HCT: 39.7 % (ref 36.0–46.0)
Hemoglobin: 12.6 g/dL (ref 12.0–15.0)
MCH: 26.7 pg (ref 26.0–34.0)
MCHC: 31.7 g/dL (ref 30.0–36.0)
MCV: 84.1 fL (ref 80.0–100.0)
Platelets: 409 10*3/uL — ABNORMAL HIGH (ref 150–400)
RBC: 4.72 MIL/uL (ref 3.87–5.11)
RDW: 13.6 % (ref 11.5–15.5)
WBC: 12.2 10*3/uL — ABNORMAL HIGH (ref 4.0–10.5)
nRBC: 0 % (ref 0.0–0.2)

## 2021-12-04 LAB — LIPASE, BLOOD: Lipase: 32 U/L (ref 11–51)

## 2021-12-04 LAB — BASIC METABOLIC PANEL
Anion gap: 10 (ref 5–15)
BUN: 11 mg/dL (ref 8–23)
CO2: 32 mmol/L (ref 22–32)
Calcium: 9.5 mg/dL (ref 8.9–10.3)
Chloride: 99 mmol/L (ref 98–111)
Creatinine, Ser: 0.63 mg/dL (ref 0.44–1.00)
GFR, Estimated: >60 mL/min (ref >60)
Glucose, Bld: 130 mg/dL — ABNORMAL HIGH (ref 70–99)
Potassium: 3.3 mmol/L — ABNORMAL LOW (ref 3.5–5.1)
Sodium: 141 mmol/L (ref 135–145)

## 2021-12-04 LAB — RESP PANEL BY RT-PCR (FLU A&B, COVID) ARPGX2
Influenza A by PCR: NEGATIVE
Influenza B by PCR: NEGATIVE
SARS Coronavirus 2 by RT PCR: NEGATIVE

## 2021-12-04 LAB — TROPONIN I (HIGH SENSITIVITY)
Troponin I (High Sensitivity): 4 ng/L (ref <18)
Troponin I (High Sensitivity): 5 ng/L (ref <18)

## 2021-12-04 LAB — HEPATIC FUNCTION PANEL
ALT: 10 U/L (ref 0–44)
AST: 19 U/L (ref 15–41)
Albumin: 4 g/dL (ref 3.5–5.0)
Alkaline Phosphatase: 62 U/L (ref 38–126)
Bilirubin, Direct: <0.1 mg/dL (ref 0.0–0.2)
Total Bilirubin: 0.7 mg/dL (ref 0.3–1.2)
Total Protein: 7.3 g/dL (ref 6.5–8.1)

## 2021-12-04 LAB — GROUP A STREP BY PCR: Group A Strep by PCR: NOT DETECTED

## 2021-12-04 MED ORDER — IOHEXOL 350 MG/ML SOLN
75.0000 mL | Freq: Once | INTRAVENOUS | Status: AC | PRN
  Administered 2021-12-04: 75 mL via INTRAVENOUS
  Filled 2021-12-04: qty 75

## 2021-12-04 MED ORDER — HYDROCORTISONE SOD SUC (PF) 250 MG IJ SOLR
200.0000 mg | Freq: Once | INTRAMUSCULAR | Status: AC
  Administered 2021-12-04: 200 mg via INTRAVENOUS
  Filled 2021-12-04: qty 200

## 2021-12-04 MED ORDER — SODIUM CHLORIDE 0.9 % IV BOLUS
500.0000 mL | Freq: Once | INTRAVENOUS | Status: AC
  Administered 2021-12-04: 500 mL via INTRAVENOUS

## 2021-12-04 MED ORDER — DIPHENHYDRAMINE HCL 50 MG/ML IJ SOLN
50.0000 mg | Freq: Once | INTRAMUSCULAR | Status: AC
  Administered 2021-12-04: 50 mg via INTRAVENOUS
  Filled 2021-12-04: qty 1

## 2021-12-04 MED ORDER — DIPHENHYDRAMINE HCL 25 MG PO CAPS: 50.0000 mg | ORAL_CAPSULE | Freq: Once | ORAL | Status: AC

## 2021-12-04 NOTE — ED Provider Notes (Signed)
[facility] Provider Note    Event Date/Time   First MD Initiated Contact with Patient 12/04/21 1305     (approximate)   History   Palpitations and Sore Throat   HPI  Selena Walker is a 66 y.o. female presents to the ER for evaluation of 3 days of sore throat as well as some palpitations this morning.  He had some mild chest discomfort associated with it.  States that when she eats or drinks something she feels nauseated like it comes back up.  She denies any abdominal pain.  No change in phonation.  She does not take any ACE inhibitor's.  Denies any pain ripping or tearing through to her back.  No pain radiating to her jaw.  Has felt hot and cold.     Physical Exam   Triage Vital Signs: ED Triage Vitals [12/04/21 1211]  Enc Vitals Group     BP (!) 147/73     Pulse Rate (!) 102     Resp 16     Temp 98.4 F (36.9 C)     Temp Source Oral     SpO2 95 %     Weight 220 lb (99.8 kg)     Height 5\' 6"  (1.676 m)     Head Circumference      Peak Flow      Pain Score 0     Pain Loc      Pain Edu?      Excl. in [place]?     Most recent vital signs: Vitals:   12/04/21 1211  BP: (!) 147/73  Pulse: (!) 102  Resp: 16  Temp: 98.4 F (36.9 C)  SpO2: 95%     Constitutional: Alert  Eyes: Conjunctivae are normal.  Head: Atraumatic. Nose: No congestion/rhinnorhea. Mouth/Throat: Mucous membranes are moist.  Bilateral tonsillar erythema without exudates.  Uvula midline no angioedema. Neck: Painless ROM.  Cardiovascular:   Good peripheral circulation. Respiratory: Normal respiratory effort.  No retractions.  Gastrointestinal: Soft and nontender.  Musculoskeletal:  no deformity Neurologic:  MAE spontaneously. No gross focal neurologic deficits are appreciated.  Skin:  Skin is warm, dry and intact. No rash noted. Psychiatric: Mood and affect are normal. Speech and behavior are normal.    ED Results / Procedures / Treatments   Labs (all labs ordered  are listed, but only abnormal results are displayed) Labs Reviewed  BASIC METABOLIC PANEL - Abnormal; Notable for the following components:      Result Value   Potassium 3.3 (*)    Glucose, Bld 130 (*)    All other components within normal limits  CBC - Abnormal; Notable for the following components:   WBC 12.2 (*)    Platelets 409 (*)    All other components within normal limits  GROUP A STREP BY PCR  RESP PANEL BY RT-PCR (FLU A&B, COVID) ARPGX2  LIPASE, BLOOD  HEPATIC FUNCTION PANEL  TROPONIN I (HIGH SENSITIVITY)  TROPONIN I (HIGH SENSITIVITY)     EKG  ED ECG REPORT I, Merlyn Lot, the attending physician, personally viewed and interpreted this ECG.   Date: 12/04/2021  EKG Time: 12:08  Rate: 100  Rhythm: sinus  Axis: left  Intervals:  normal  ST&T Change: no stemi, no depressions    RADIOLOGY Please see ED Course for my review and interpretation.  I personally reviewed all radiographic images ordered to evaluate for the above acute complaints and reviewed radiology reports and findings.  These findings  were personally discussed with the patient.  Please see medical record for radiology report.    PROCEDURES:  Critical Care performed: No  Procedures   MEDICATIONS ORDERED IN ED: Medications  hydrocortisone sodium succinate (SOLU-CORTEF) injection 200 mg (has no administration in time range)  diphenhydrAMINE (BENADRYL) capsule 50 mg (has no administration in time range)    Or  diphenhydrAMINE (BENADRYL) injection 50 mg (has no administration in time range)  sodium chloride 0.9 % bolus 500 mL (has no administration in time range)     IMPRESSION / MDM / ASSESSMENT AND PLAN / ED COURSE  I reviewed the triage vital signs and the nursing notes.                              Differential diagnosis includes, but is not limited to, URI, strep throat, COVID, flu, pharyngitis, RPA, PTA, ACS, dissection, pneumonia, angioedema  Patient presenting with chief  complaint of palpitations chest discomfort going up into her throat and having trouble swallowing.  Sore throat started to 3 days.  She he is not on any ACE inhibitors.  EKG with nonspecific changes not consistent with ischemia initial troponin negative is tachycardic not complaining of pleuritic pain or shortness of breath.  Her abdominal exam is soft and benign.  Viral tests are negative.  Given her presentation do feel that CTA clinically indicated will rule out dissection or soft tissue mass.  Patient signed out to oncoming physician pending CTA.      FINAL CLINICAL IMPRESSION(S) / ED DIAGNOSES   Final diagnoses:  Palpitations  Sore throat     Rx / DC Orders   ED Discharge Orders     None        Note:  This document was prepared using Dragon voice recognition software and may include unintentional dictation errors.    Merlyn Lot, MD 12/04/21 [phone]

## 2021-12-04 NOTE — ED Notes (Signed)
See triage note  states she developed a sore throat about 3-4 days ago  denies any fever or states has had chills    afebrile on arrival   this am states she felt like her heart was racing  pt is SOB with exertion

## 2021-12-04 NOTE — ED Triage Notes (Signed)
Pt here with palpitations and a sore throat. Pt states that when she woke up this morning she felt like her heart was beating out of her chest. Pt also states that she has had a sore throat for 3 days now that has not gotten any better. Pt denies allergies to anything new. Pt in NAD in triage.

## 2021-12-04 NOTE — Discharge Instructions (Signed)
Please seek medical attention for any high fevers, chest pain, shortness of breath, change in behavior, persistent vomiting, bloody stool or any other new or concerning symptoms.  

## 2021-12-04 NOTE — ED Provider Notes (Signed)
CT imaging without concerning findings. Discussed this with the patient. Will plan on discharging home.    Nance Pear, MD 12/04/21 2225

## 2022-07-03 ENCOUNTER — Other Ambulatory Visit: Payer: Self-pay

## 2022-07-03 ENCOUNTER — Encounter: Payer: Self-pay | Admitting: Emergency Medicine

## 2022-07-03 ENCOUNTER — Emergency Department
Admission: EM | Admit: 2022-07-03 | Discharge: 2022-07-03 | Disposition: A | Discharge status: Home / Self Care | Payer: Medicare HMO | Attending: Emergency Medicine | Admitting: Emergency Medicine

## 2022-07-03 DIAGNOSIS — J45909 Unspecified asthma, uncomplicated: Secondary | ICD-10-CM | POA: Diagnosis not present

## 2022-07-03 DIAGNOSIS — I10 Essential (primary) hypertension: Secondary | ICD-10-CM | POA: Insufficient documentation

## 2022-07-03 DIAGNOSIS — K047 Periapical abscess without sinus: Secondary | ICD-10-CM | POA: Insufficient documentation

## 2022-07-03 DIAGNOSIS — K0889 Other specified disorders of teeth and supporting structures: Secondary | ICD-10-CM | POA: Diagnosis present

## 2022-07-03 MED ORDER — CLINDAMYCIN HCL 300 MG PO CAPS: 300.0000 mg | ORAL_CAPSULE | Freq: Three times a day (TID) | ORAL | 0 refills | Status: AC

## 2022-07-03 MED ORDER — OXYCODONE HCL 5 MG PO TABS: 5.0000 mg | ORAL_TABLET | Freq: Four times a day (QID) | ORAL | 0 refills | Status: DC | PRN

## 2022-07-03 NOTE — ED Provider Notes (Signed)
[facility] Provider Note    Event Date/Time   First MD Initiated Contact with Patient 07/03/22 1116     (approximate)   History   Dental Pain   HPI  Selena Walker is a 66 y.o. female   presents to the ED with complaint of a broken tooth that has been going on for several months and was hoping to get it pulled today while in the emergency department.  Patient states she attempted to pull it herself but had to stop due to severe pain.  Patient states she does have a dentist but does not currently have an appointment.  Patient has a history of hypertension, sleep apnea, TIA, asthma, syncope, glaucoma and GERD.      Physical Exam   Triage Vital Signs: ED Triage Vitals [07/03/22 1037]  Enc Vitals Group     BP 139/67     Pulse Rate 92     Resp 18     Temp 98.2 F (36.8 C)     Temp Source Oral     SpO2 99 %     Weight 220 lb 0.3 oz (99.8 kg)     Height '5\' 6"'$  (1.676 m)     Head Circumference      Peak Flow      Pain Score 10     Pain Loc      Pain Edu?      Excl. in [place]?     Most recent vital signs: Vitals:   07/03/22 1037  BP: 139/67  Pulse: 92  Resp: 18  Temp: 98.2 F (36.8 C)  SpO2: 99%     General: Awake, no distress.  CV:  Good peripheral perfusion.  Resp:  Normal effort.  Lungs are clear bilaterally. Abd:  No distention.  Other:  Right upper premolars are in very poor hygiene and repair with gums edematous and tender to palpation with a tongue depressor.  No cervical lymphadenopathy is appreciated.  No active drainage was noted.   ED Results / Procedures / Treatments   Labs (all labs ordered are listed, but only abnormal results are displayed) Labs Reviewed - No data to display    PROCEDURES:  Critical Care performed:   Procedures   MEDICATIONS ORDERED IN ED: Medications - No data to display   IMPRESSION / MDM / [place] / ED COURSE  I reviewed the triage vital signs and the nursing  notes.   Differential diagnosis includes, but is not limited to, dental abscess, dental pain, gingivitis.  66 year old female presents to the ED with complaint of dental pain.  Gums are edematous and tender to palpation.  Teeth are in poor repair and hygiene.  Patient is strongly encouraged to call make an appointment with her dentist to be seen.  A prescription for clindamycin was sent to her pharmacy to get into taking along with hydrocodone as needed for pain.      Patient's presentation is most consistent with acute, uncomplicated illness.  FINAL CLINICAL IMPRESSION(S) / ED DIAGNOSES   Final diagnoses:  Dental abscess     Rx / DC Orders   ED Discharge Orders          Ordered    clindamycin (CLEOCIN) 300 MG capsule  3 times daily        07/03/22 1127    oxyCODONE (OXY IR/ROXICODONE) 5 MG immediate release tablet  Every 6 hours PRN        07/03/22 1130  Note:  This document was prepared using Dragon voice recognition software and may include unintentional dictation errors.   Johnn Hai, PA-C 07/03/22 1324    Blake Divine, MD 07/03/22 1949

## 2022-07-03 NOTE — Discharge Instructions (Addendum)
Call your dentist today to make an appointment.  Let them know that you began antibiotics today.  Medication as directed.

## 2022-07-03 NOTE — ED Triage Notes (Signed)
Pt here with a broken tooth. Pt states she was attempting to pull it herself but the pain is severe.

## 2022-09-02 ENCOUNTER — Other Ambulatory Visit: Payer: Self-pay | Admitting: Family Medicine

## 2022-09-02 DIAGNOSIS — Z1231 Encounter for screening mammogram for malignant neoplasm of breast: Secondary | ICD-10-CM

## 2022-09-02 DIAGNOSIS — Z780 Asymptomatic menopausal state: Secondary | ICD-10-CM

## 2022-09-02 DIAGNOSIS — M810 Age-related osteoporosis without current pathological fracture: Secondary | ICD-10-CM

## 2022-09-11 ENCOUNTER — Ambulatory Visit
Admission: RE | Admit: 2022-09-11 | Discharge: 2022-09-11 | Disposition: A | Discharge status: Home / Self Care | Payer: Medicare HMO | Source: Ambulatory Visit | Attending: Family Medicine | Admitting: Family Medicine

## 2022-09-11 DIAGNOSIS — Z1231 Encounter for screening mammogram for malignant neoplasm of breast: Secondary | ICD-10-CM | POA: Insufficient documentation

## 2022-10-24 ENCOUNTER — Ambulatory Visit
Admission: RE | Admit: 2022-10-24 | Discharge: 2022-10-24 | Disposition: A | Discharge status: Home / Self Care | Payer: Medicare HMO | Source: Ambulatory Visit | Attending: Family Medicine | Admitting: Family Medicine

## 2022-10-24 ENCOUNTER — Other Ambulatory Visit: Payer: Self-pay | Admitting: Student

## 2022-10-24 DIAGNOSIS — J189 Pneumonia, unspecified organism: Secondary | ICD-10-CM | POA: Diagnosis not present

## 2023-02-28 ENCOUNTER — Encounter: Payer: Self-pay | Admitting: Emergency Medicine

## 2023-02-28 ENCOUNTER — Ambulatory Visit
Admission: EM | Admit: 2023-02-28 | Discharge: 2023-02-28 | Disposition: A | Discharge status: Home / Self Care | Payer: Medicare HMO | Attending: Physician Assistant | Admitting: Physician Assistant

## 2023-02-28 ENCOUNTER — Ambulatory Visit (INDEPENDENT_AMBULATORY_CARE_PROVIDER_SITE_OTHER): Payer: Medicare HMO

## 2023-02-28 ENCOUNTER — Other Ambulatory Visit: Payer: Self-pay

## 2023-02-28 DIAGNOSIS — E860 Dehydration: Secondary | ICD-10-CM | POA: Diagnosis not present

## 2023-02-28 DIAGNOSIS — R197 Diarrhea, unspecified: Secondary | ICD-10-CM | POA: Diagnosis not present

## 2023-02-28 LAB — POCT URINALYSIS DIP (MANUAL ENTRY)
Glucose, UA: NEGATIVE mg/dL
Ketones, POC UA: NEGATIVE mg/dL
Leukocytes, UA: NEGATIVE
Nitrite, UA: NEGATIVE
Spec Grav, UA: >=1.03 — AB (ref 1.010–1.025)
Urobilinogen, UA: 0.2 E.U./dL
pH, UA: 5.5 (ref 5.0–8.0)

## 2023-02-28 MED ORDER — SODIUM CHLORIDE 0.9 % IV BOLUS
1000.0000 mL | Freq: Once | INTRAVENOUS | Status: AC
  Administered 2023-02-28: 1000 mL via INTRAVENOUS

## 2023-02-28 NOTE — ED Provider Notes (Signed)
[facility]    CSN: [id number] Arrival date & time: 02/28/23  0910      History   Chief Complaint No chief complaint on file.   HPI Selena Walker is a 67 y.o. female.   Patient presents today with a 3-day history of diarrhea.  She reports having 5-10 watery bowel movements per 24 hours without blood or mucus.  She has tried loperamide as well as electrolyte solution to help ensure that she is staying hydrated without improvement of symptoms.  She denies any associated nausea or vomiting.  Reports abdominal cramping but denies any significant pain.  She denies any known sick contacts, recent travel, medication changes, suspicious food intake.  She denies history of gastrointestinal disorder including ulcerative colitis or Crohn's disease.  She reports that she is feeling very poorly and just generally feels weak.  She denies any associated melena or hematochezia.    Past Medical History:  Diagnosis Date   Asthma    Depression    GERD (gastroesophageal reflux disease)    Glaucoma    History of echocardiogram    a. 12/2016 Echo: EF 60-65%.   History of stress test    a. 04/2015 ETT: Ex time 3:12, Max HR 162, BP max 235/115. No ischemic changes.   Hyperlipidemia    Hypertension    Left breast mass    a. 09/2017 CTA Chest: 2.5 cm Left retroareolar breast mass - rec outpt f/u.   Meningioma    a. 12/2016 MRI Brain: 6mm parafalcine meningioma, stable since 2013.   Migraines    Nodule of upper lobe of right lung    a. 09/2017 CTA chest: 8mm ground glass nodule @ apex of RUL (rec repeat in 6-12 mos).   Obstructive sleep apnea    a. She has undergone sleep study but has not yet been fitted w/ CPAP.   Palpitations    a. 12/2017 Event monitor:  No significant arrhythmias.   Rib fractures    a. 09/2017 s/p fall w/ R posterior 8th-9th rib fx.   Syncope    a. 09/2017 - ? etiology; b. 12/2017 Event monitor:  No significant arrhythmias.   TIA (transient ischemic attack)    a. ?  TIA 12/2016 vs Migraine w/ Aura - followed by Neurology.    Patient Active Problem List   Diagnosis Date Noted   Syncope 10/07/2017   TIA (transient ischemic attack) 12/28/2016    Past Surgical History:  Procedure Laterality Date   BREAST EXCISIONAL BIOPSY Right 1990   neg   COLONOSCOPY  2007   COLONOSCOPY WITH PROPOFOL N/A 02/19/2016   Procedure: COLONOSCOPY WITH PROPOFOL;  Surgeon: Scot Junobert T Elliott, MD;  [facility];  Service: Endoscopy;  Laterality: N/A;   Lap TAHBSO  12/19/2014    OB History   No obstetric history on file.      Home Medications    Prior to Admission medications   Medication Sig Start Date End Date Taking? Authorizing Provider  albuterol (PROVENTIL HFA;VENTOLIN HFA) 108 (90 Base) MCG/ACT inhaler Inhale 2 puffs into the lungs every 4 (four) hours as needed for wheezing or shortness of breath.    [provider]  alum & mag hydroxide-simeth (MAALOX/MYLANTA) 200-200-20 MG/5ML suspension Take 5 mLs by mouth every 6 (six) hours as needed for indigestion or heartburn.    [provider]  aspirin EC 81 MG EC tablet Take 1 tablet (81 mg total) by mouth daily. 12/29/16   Milagros LollSudini, Srikar, MD  cetirizine (ZYRTEC ALLERGY) 10 MG tablet Take 1 tablet (10 mg total) by mouth daily. 12/28/20   Avegno, Zachery DakinsKomlanvi S, FNP  chlorthalidone (HYGROTON) 25 MG tablet Take 25 mg by mouth daily.    [provider]  dorzolamide-timolol (COSOPT) 22.3-6.8 MG/ML ophthalmic solution Place 1 drop into both eyes 2 (two) times daily. 04/15/16   [provider]  fluticasone (FLONASE) 50 MCG/ACT nasal spray Place 1 spray into both nostrils daily for 14 days. 12/28/20 01/11/21  Avegno, Zachery DakinsKomlanvi S, FNP  meclizine (ANTIVERT) 25 MG tablet Take 1 tablet (25 mg total) by mouth 3 (three) times daily as needed for dizziness. 10/06/20   Delton PrairieSmith, Dylan, MD  oxyCODONE (OXY IR/ROXICODONE) 5 MG immediate release tablet Take 1 tablet (5 mg total) by mouth every 6 (six) hours as  needed for severe pain. 07/03/22   Tommi RumpsSummers, Rhonda L, PA-C    Family History Family History  Problem Relation Age of Onset   Breast cancer Sister [id number]   CAD Mother    Diabetes Mother    Hypertension Mother    Migraines Mother    Hypertension Father    Stomach cancer Father    CAD Father    Colon polyps Father    Cancer Brother    Lung cancer Sister    Diabetes Sister    Multiple sclerosis Sister    Hypertension Brother    Hypertension Brother    Hypertension Brother    Hypertension Brother     Social History Social History   Tobacco Use   Smoking status: Never   Smokeless tobacco: Never  Vaping Use   Vaping Use: Never used  Substance Use Topics   Alcohol use: No   Drug use: No     Allergies   Duloxetine, Escitalopram oxalate, Gabapentin, Penicillins, Shellfish allergy, Tylenol [acetaminophen], Ivp dye [iodinated contrast media], Metrizamide, Other, and Penicillin v   Review of Systems Review of Systems  Constitutional:  Positive for activity change. Negative for appetite change, fatigue and fever.  Respiratory:  Negative for cough and shortness of breath.   Cardiovascular:  Negative for chest pain.  Gastrointestinal:  Positive for abdominal pain and diarrhea. Negative for constipation, nausea and vomiting.  Neurological:  Positive for weakness (generalized). Negative for dizziness, light-headedness and headaches.     Physical Exam Triage Vital Signs ED Triage Vitals  Enc Vitals Group     BP 02/28/23 0932 116/62     Pulse Rate 02/28/23 0932 (!) 114     Resp 02/28/23 0932 18     Temp 02/28/23 0932 98.3 F (36.8 C)     Temp Source 02/28/23 0932 Oral     SpO2 02/28/23 0932 95 %     Weight --      Height --      Head Circumference --      Peak Flow --      Pain Score 02/28/23 0934 0     Pain Loc --      Pain Edu? --      Excl. in GC? --    No data found.  Updated Vital Signs BP 115/63 (BP Location: Left Arm)   Pulse 88   Temp 98.3 F (36.8 C)  (Oral)   Resp 18   SpO2 97%   Visual Acuity Right Eye Distance:   Left Eye Distance:   Bilateral Distance:    Right Eye Near:   Left Eye Near:    Bilateral Near:     Physical Exam Vitals  reviewed.  Constitutional:      General: She is awake. She is not in acute distress.    Appearance: Normal appearance. She is well-developed. She is not ill-appearing.     Comments: Very pleasant female appears stated age in no acute distress sitting comfortably in exam room  HENT:     Head: Normocephalic and atraumatic.     Mouth/Throat:     Mouth: Mucous membranes are moist.     Pharynx: Uvula midline. No oropharyngeal exudate or posterior oropharyngeal erythema.  Cardiovascular:     Rate and Rhythm: Regular rhythm. Tachycardia present.     Heart sounds: Normal heart sounds, S1 normal and S2 normal. No murmur heard. Pulmonary:     Effort: Pulmonary effort is normal.     Breath sounds: Normal breath sounds. No wheezing, rhonchi or rales.     Comments: Clear to auscultation bilaterally Abdominal:     General: Bowel sounds are normal.     Palpations: Abdomen is soft.     Tenderness: There is generalized abdominal tenderness. There is no right CVA tenderness, left CVA tenderness, guarding or rebound.     Comments: Mild generalized tenderness palpation.  No evidence of acute abdomen on physical exam.  Musculoskeletal:     Right lower leg: No edema.     Left lower leg: No edema.  Psychiatric:        Behavior: Behavior is cooperative.      UC Treatments / Results  Labs (all labs ordered are listed, but only abnormal results are displayed) Labs Reviewed  POCT URINALYSIS DIP (MANUAL ENTRY) - Abnormal; Notable for the following components:      Result Value   Bilirubin, UA small (*)    Spec Grav, UA >=1.030 (*)    Blood, UA moderate (*)    Protein Ur, POC trace (*)    All other components within normal limits  CBC WITH DIFFERENTIAL/PLATELET  COMPREHENSIVE METABOLIC PANEL     EKG   Radiology DG Abdomen 1 View  Result Date: 02/28/2023 CLINICAL DATA:  Diarrhea. EXAM: ABDOMEN - 1 VIEW COMPARISON:  CT abdomen/pelvis 10/06/2017 FINDINGS: There is a nonobstructive bowel gas pattern. There is no definite free intraperitoneal air, within the confines of supine technique. There is no gross organomegaly or abnormal soft tissue calcification There is no acute osseous abnormality. IMPRESSION: Unremarkable KUB with no acute finding. Electronically Signed   By: Lesia Hausen M.D.   On: 02/28/2023 10:18    Procedures Procedures (including critical care time)  Medications Ordered in UC Medications  sodium chloride 0.9 % bolus 1,000 mL (0 mLs Intravenous Stopped 02/28/23 1122)    Initial Impression / Assessment and Plan / UC Course  I have reviewed the triage vital signs and the nursing notes.  Pertinent labs & imaging results that were available during my care of the patient were reviewed by me and considered in my medical decision making (see chart for details).     Patient was initially tachycardic but otherwise well-appearing and afebrile.  Physical exam was reassuring with no indication for emergent evaluation or imaging.  EKG was obtained which showed sinus tachycardia with ventricular rate of 104 bpm without ischemic changes; compared to 12/04/2021 no significant changes.  UA showed increased specific gravity concerning for dehydration.  She was given a liter of fluids with significant improvement of symptoms and resolution of tachycardia.  CBC and CMP were obtained today and are pending.  We will contact her if we need to arrange any  treatment based on these results.  Will defer stool studies for the time being but discussed that if her symptoms or not improving quickly (within a few days) she should return we will consider additional testing.  Recommend she follow-up closely with her primary care.  Discussed that she should have a low threshold for going to the emergency  room if she has any lightheadedness, chest pain, shortness of breath, abdominal pain, weakness, melena, hematochezia, nausea/vomiting she should be seen immediately.  Strict return precautions given to which she expressed understanding.  Final Clinical Impressions(s) / UC Diagnoses   Final diagnoses:  Diarrhea, unspecified type  Dehydration     Discharge Instructions      I am glad that you are feeling much better after your fluids.  Please continue to push fluids at home and eat a bland diet.  We will contact you if your blood work is abnormal.  Follow-up with your primary care first thing next week.  If you have any changing or worsening symptoms including lightheadedness, abdominal pain, fever, chest pain, shortness of breath, blood in your stool you need to go to the emergency room.     ED Prescriptions   None    PDMP not reviewed this encounter.   Jeani Hawking, PA-C 02/28/23 1150

## 2023-02-28 NOTE — Discharge Instructions (Signed)
I am glad that you are feeling much better after your fluids.  Please continue to push fluids at home and eat a bland diet.  We will contact you if your blood work is abnormal.  Follow-up with your primary care first thing next week.  If you have any changing or worsening symptoms including lightheadedness, abdominal pain, fever, chest pain, shortness of breath, blood in your stool you need to go to the emergency room.

## 2023-02-28 NOTE — ED Triage Notes (Signed)
Diarrhea since Tuesday.  Has been taking loperamide without relief.  Has been drinking liquid IV to stay hydrated.

## 2023-03-01 LAB — COMPREHENSIVE METABOLIC PANEL
ALT: 11 IU/L (ref 0–32)
AST: 22 IU/L (ref 0–40)
Albumin/Globulin Ratio: 1.7 (ref 1.2–2.2)
Albumin: 4.6 g/dL (ref 3.9–4.9)
Alkaline Phosphatase: 71 IU/L (ref 44–121)
BUN/Creatinine Ratio: 12 (ref 12–28)
BUN: 9 mg/dL (ref 8–27)
Bilirubin Total: 0.2 mg/dL (ref 0.0–1.2)
CO2: 26 mmol/L (ref 20–29)
Calcium: 9.7 mg/dL (ref 8.7–10.3)
Chloride: 98 mmol/L (ref 96–106)
Creatinine, Ser: 0.73 mg/dL (ref 0.57–1.00)
Globulin, Total: 2.7 g/dL (ref 1.5–4.5)
Glucose: 120 mg/dL — ABNORMAL HIGH (ref 70–99)
Potassium: 3.5 mmol/L (ref 3.5–5.2)
Sodium: 140 mmol/L (ref 134–144)
Total Protein: 7.3 g/dL (ref 6.0–8.5)
eGFR: 90 mL/min/{1.73_m2} (ref >59)

## 2023-03-01 LAB — CBC WITH DIFFERENTIAL/PLATELET
Basophils Absolute: 0 10*3/uL (ref 0.0–0.2)
Basos: 0 %
EOS (ABSOLUTE): 0 10*3/uL (ref 0.0–0.4)
Eos: 0 %
Hematocrit: 41.8 % (ref 34.0–46.6)
Hemoglobin: 13.1 g/dL (ref 11.1–15.9)
Immature Grans (Abs): 0 10*3/uL (ref 0.0–0.1)
Immature Granulocytes: 0 %
Lymphocytes Absolute: 0.8 10*3/uL (ref 0.7–3.1)
Lymphs: 11 %
MCH: 26.5 pg — ABNORMAL LOW (ref 26.6–33.0)
MCHC: 31.3 g/dL — ABNORMAL LOW (ref 31.5–35.7)
MCV: 85 fL (ref 79–97)
Monocytes Absolute: 0.5 10*3/uL (ref 0.1–0.9)
Monocytes: 7 %
Neutrophils Absolute: 5.4 10*3/uL (ref 1.4–7.0)
Neutrophils: 82 %
Platelets: 358 10*3/uL (ref 150–450)
RBC: 4.94 x10E6/uL (ref 3.77–5.28)
RDW: 13.2 % (ref 11.7–15.4)
WBC: 6.7 10*3/uL (ref 3.4–10.8)

## 2023-03-02 ENCOUNTER — Other Ambulatory Visit: Payer: Self-pay

## 2023-03-02 ENCOUNTER — Emergency Department
Admission: EM | Admit: 2023-03-02 | Discharge: 2023-03-03 | Disposition: A | Discharge status: Home / Self Care | Payer: Medicare HMO | Attending: Emergency Medicine | Admitting: Emergency Medicine

## 2023-03-02 ENCOUNTER — Emergency Department: Payer: Medicare HMO

## 2023-03-02 DIAGNOSIS — Z7982 Long term (current) use of aspirin: Secondary | ICD-10-CM | POA: Insufficient documentation

## 2023-03-02 DIAGNOSIS — R197 Diarrhea, unspecified: Secondary | ICD-10-CM | POA: Diagnosis present

## 2023-03-02 DIAGNOSIS — E876 Hypokalemia: Secondary | ICD-10-CM | POA: Insufficient documentation

## 2023-03-02 DIAGNOSIS — I10 Essential (primary) hypertension: Secondary | ICD-10-CM | POA: Insufficient documentation

## 2023-03-02 DIAGNOSIS — R109 Unspecified abdominal pain: Secondary | ICD-10-CM | POA: Diagnosis not present

## 2023-03-02 DIAGNOSIS — J45909 Unspecified asthma, uncomplicated: Secondary | ICD-10-CM | POA: Insufficient documentation

## 2023-03-02 LAB — COMPREHENSIVE METABOLIC PANEL
ALT: 14 U/L (ref 0–44)
AST: 27 U/L (ref 15–41)
Albumin: 4.2 g/dL (ref 3.5–5.0)
Alkaline Phosphatase: 63 U/L (ref 38–126)
Anion gap: 10 (ref 5–15)
BUN: 9 mg/dL (ref 8–23)
CO2: 31 mmol/L (ref 22–32)
Calcium: 9.3 mg/dL (ref 8.9–10.3)
Chloride: 98 mmol/L (ref 98–111)
Creatinine, Ser: 0.73 mg/dL (ref 0.44–1.00)
GFR, Estimated: >60 mL/min (ref >60)
Glucose, Bld: 110 mg/dL — ABNORMAL HIGH (ref 70–99)
Potassium: 3 mmol/L — ABNORMAL LOW (ref 3.5–5.1)
Sodium: 139 mmol/L (ref 135–145)
Total Bilirubin: 0.7 mg/dL (ref 0.3–1.2)
Total Protein: 7.3 g/dL (ref 6.5–8.1)

## 2023-03-02 LAB — URINALYSIS, ROUTINE W REFLEX MICROSCOPIC
Bilirubin Urine: NEGATIVE
Glucose, UA: NEGATIVE mg/dL
Ketones, ur: NEGATIVE mg/dL
Leukocytes,Ua: NEGATIVE
Nitrite: NEGATIVE
Protein, ur: NEGATIVE mg/dL
Specific Gravity, Urine: 1.006 (ref 1.005–1.030)
pH: 6 (ref 5.0–8.0)

## 2023-03-02 LAB — CBC
HCT: 41.6 % (ref 36.0–46.0)
Hemoglobin: 12.9 g/dL (ref 12.0–15.0)
MCH: 26.1 pg (ref 26.0–34.0)
MCHC: 31 g/dL (ref 30.0–36.0)
MCV: 84 fL (ref 80.0–100.0)
Platelets: 352 10*3/uL (ref 150–400)
RBC: 4.95 MIL/uL (ref 3.87–5.11)
RDW: 13.9 % (ref 11.5–15.5)
WBC: 6.7 10*3/uL (ref 4.0–10.5)
nRBC: 0 % (ref 0.0–0.2)

## 2023-03-02 LAB — LIPASE, BLOOD: Lipase: 32 U/L (ref 11–51)

## 2023-03-02 MED ORDER — POTASSIUM CHLORIDE CRYS ER 20 MEQ PO TBCR
40.0000 meq | EXTENDED_RELEASE_TABLET | Freq: Once | ORAL | Status: AC
  Administered 2023-03-03: 40 meq via ORAL
  Filled 2023-03-02: qty 2

## 2023-03-02 MED ORDER — SODIUM CHLORIDE 0.9 % IV BOLUS
1000.0000 mL | Freq: Once | INTRAVENOUS | Status: AC
  Administered 2023-03-03: 1000 mL via INTRAVENOUS

## 2023-03-02 NOTE — ED Provider Notes (Signed)
[facility] Provider Note    Event Date/Time   First MD Initiated Contact with Patient 03/02/23 2306     (approximate)   History   Diarrhea   HPI  Selena Walker is a 67 y.o. female who presents to the ED from home with a chief complaint of diarrhea.  Patient reports up to 7 loose stools daily for the past 6 days.  Seen at urgent care 2 days ago for same.  Taking loperamide without relief of symptoms.  Denies associated fever/chills, chest pain, shortness of breath, abdominal pain, nausea, vomiting or dysuria.  Denies recent travel, camping or antibiotic use.     Past Medical History   Past Medical History:  Diagnosis Date   Asthma    Depression    GERD (gastroesophageal reflux disease)    Glaucoma    History of echocardiogram    a. 12/2016 Echo: EF 60-65%.   History of stress test    a. 04/2015 ETT: Ex time 3:12, Max HR 162, BP max 235/115. No ischemic changes.   Hyperlipidemia    Hypertension    Left breast mass    a. 09/2017 CTA Chest: 2.5 cm Left retroareolar breast mass - rec outpt f/u.   Meningioma    a. 12/2016 MRI Brain: 6mm parafalcine meningioma, stable since 2013.   Migraines    Nodule of upper lobe of right lung    a. 09/2017 CTA chest: 8mm ground glass nodule @ apex of RUL (rec repeat in 6-12 mos).   Obstructive sleep apnea    a. She has undergone sleep study but has not yet been fitted w/ CPAP.   Palpitations    a. 12/2017 Event monitor:  No significant arrhythmias.   Rib fractures    a. 09/2017 s/p fall w/ R posterior 8th-9th rib fx.   Syncope    a. 09/2017 - ? etiology; b. 12/2017 Event monitor:  No significant arrhythmias.   TIA (transient ischemic attack)    a. ? TIA 12/2016 vs Migraine w/ Aura - followed by Neurology.     Active Problem List   Patient Active Problem List   Diagnosis Date Noted   Syncope 10/07/2017   TIA (transient ischemic attack) 12/28/2016     Past Surgical History   Past Surgical History:   Procedure Laterality Date   BREAST EXCISIONAL BIOPSY Right 1990   neg   COLONOSCOPY  2007   COLONOSCOPY WITH PROPOFOL N/A 02/19/2016   Procedure: COLONOSCOPY WITH PROPOFOL;  Surgeon: Scot Jun, MD;  Location: [facility] END[facility]COPY;  Service: Endoscopy;  Laterality: N/A;   Lap TAHBSO  12/19/2014     Home Medications   Prior to Admission medications   Medication Sig Start Date End Date Taking? Authorizing Provider  ciprofloxacin (CIPRO) 500 MG tablet Take 1 tablet (500 mg total) by mouth 2 (two) times daily for 5 days. 03/03/23 03/08/23 Yes Irean Hong, MD  albuterol (PROVENTIL HFA;VENTOLIN HFA) 108 (90 Base) MCG/ACT inhaler Inhale 2 puffs into the lungs every 4 (four) hours as needed for wheezing or shortness of breath.    [provider]  alum & mag hydroxide-simeth (MAALOX/MYLANTA) 200-200-20 MG/5ML suspension Take 5 mLs by mouth every 6 (six) hours as needed for indigestion or heartburn.    [provider]  aspirin EC 81 MG EC tablet Take 1 tablet (81 mg total) by mouth daily. 12/29/16   Milagros Loll, MD  cetirizine (ZYRTEC ALLERGY) 10 MG tablet Take 1 tablet (10 mg  total) by mouth daily. 12/28/20   Avegno, Zachery Dakins, FNP  chlorthalidone (HYGROTON) 25 MG tablet Take 25 mg by mouth daily.    [provider]  dorzolamide-timolol (COSOPT) 22.3-6.8 MG/ML ophthalmic solution Place 1 drop into both eyes 2 (two) times daily. 04/15/16   [provider]  fluticasone (FLONASE) 50 MCG/ACT nasal spray Place 1 spray into both nostrils daily for 14 days. 12/28/20 01/11/21  Avegno, Zachery Dakins, FNP  meclizine (ANTIVERT) 25 MG tablet Take 1 tablet (25 mg total) by mouth 3 (three) times daily as needed for dizziness. 10/06/20   Delton Prairie, MD  oxyCODONE (OXY IR/ROXICODONE) 5 MG immediate release tablet Take 1 tablet (5 mg total) by mouth every 6 (six) hours as needed for severe pain. 07/03/22   Tommi Rumps, PA-C     Allergies  Duloxetine, Escitalopram oxalate,  Gabapentin, Penicillins, Shellfish allergy, Tylenol [acetaminophen], Ivp dye [iodinated contrast media], Metrizamide, Other, and Penicillin v   Family History   Family History  Problem Relation Age of Onset   Breast cancer Sister 70   CAD Mother    Diabetes Mother    Hypertension Mother    Migraines Mother    Hypertension Father    Stomach cancer Father    CAD Father    Colon polyps Father    Cancer Brother    Lung cancer Sister    Diabetes Sister    Multiple sclerosis Sister    Hypertension Brother    Hypertension Brother    Hypertension Brother    Hypertension Brother      Physical Exam  Triage Vital Signs: ED Triage Vitals  Enc Vitals Group     BP 03/02/23 1824 (!) 159/82     Pulse Rate 03/02/23 1824 (!) 102     Resp 03/02/23 1824 16     Temp 03/02/23 1824 98.2 F (36.8 C)     Temp Source 03/02/23 1824 Oral     SpO2 03/02/23 1824 98 %     Weight 03/02/23 1825 205 lb (93 kg)     Height 03/02/23 1825 5\' 9"  (1.753 m)     Head Circumference --      Peak Flow --      Pain Score 03/02/23 1824 0     Pain Loc --      Pain Edu? --      Excl. in GC? --     Updated Vital Signs: BP 128/65   Pulse 84   Temp 98.2 F (36.8 C) (Oral)   Resp 18   Ht 5\' 9"  (1.753 m)   Wt 93 kg   SpO2 96%   BMI 30.27 kg/m    General: Awake, no distress.  CV:  RRR.  Good peripheral perfusion.  Resp:  Normal effort.  CTAB. Abd:  Nontender.  No distention.  Other:  No truncal vesicles.   ED Results / Procedures / Treatments  Labs (all labs ordered are listed, but only abnormal results are displayed) Labs Reviewed  GASTROINTESTINAL PANEL BY PCR, STOOL (REPLACES STOOL CULTURE) - Abnormal; Notable for the following components:      Result Value   Rotavirus A DETECTED (*)    All other components within normal limits  COMPREHENSIVE METABOLIC PANEL - Abnormal; Notable for the following components:   Potassium 3.0 (*)    Glucose, Bld 110 (*)    All other components within normal  limits  URINALYSIS, ROUTINE W REFLEX MICROSCOPIC - Abnormal; Notable for the following components:  Color, Urine YELLOW (*)    APPearance CLEAR (*)    Hgb urine dipstick MODERATE (*)    Bacteria, UA RARE (*)    All other components within normal limits  C DIFFICILE QUICK SCREEN W PCR REFLEX    LIPASE, BLOOD  CBC     EKG  None   RADIOLOGY I have independently visualized and interpreted patient's CT scan as well as noted the radiology interpretation:  CT abdomen/pelvis: No acute findings  Official radiology report(s): CT ABDOMEN PELVIS WO CONTRAST  Result Date: 03/03/2023 CLINICAL DATA:  Acute abdominal pain for 2 days with diarrhea, initial encounter EXAM: CT ABDOMEN AND PELVIS WITHOUT CONTRAST TECHNIQUE: Multidetector CT imaging of the abdomen and pelvis was performed following the standard protocol without IV contrast. RADIATION DOSE REDUCTION: This exam was performed according to the departmental dose-optimization program which includes automated exposure control, adjustment of the mA and/or kV according to patient size and/or use of iterative reconstruction technique. COMPARISON:  10/06/2017 FINDINGS: Lower chest: No acute abnormality. Hepatobiliary: No focal liver abnormality is seen. No gallstones, gallbladder wall thickening, or biliary dilatation. Pancreas: Unremarkable. No pancreatic ductal dilatation or surrounding inflammatory changes. Spleen: Normal in size without focal abnormality. Adrenals/Urinary Tract: Adrenal glands are within normal limits. Kidneys are well visualized bilaterally. No renal calculi or obstructive changes are seen. The bladder is decompressed. Stomach/Bowel: No obstructive or inflammatory changes of the colon are seen. The appendix is well visualized and within normal limits. Small bowel and stomach are unremarkable. Vascular/Lymphatic: Aortic atherosclerosis. No enlarged abdominal or pelvic lymph nodes. Reproductive: Status post hysterectomy. No adnexal  masses. Other: No abdominal wall hernia or abnormality. No abdominopelvic ascites. Musculoskeletal: No acute or significant osseous findings. IMPRESSION: No acute abnormality noted. Electronically Signed   By: Alcide Clever M.D.   On: 03/03/2023 00:20     PROCEDURES:  Critical Care performed: No  Procedures   MEDICATIONS ORDERED IN ED: Medications  sodium chloride 0.9 % bolus 1,000 mL (0 mLs Intravenous Stopped 03/03/23 0149)  potassium chloride SA (KLOR-CON M) CR tablet 40 mEq (40 mEq Oral Given 03/03/23 0010)  ciprofloxacin (CIPRO) tablet 500 mg (500 mg Oral Given 03/03/23 0206)     IMPRESSION / MDM / ASSESSMENT AND PLAN / ED COURSE  I reviewed the triage vital signs and the nursing notes.                             67 year old female presenting with diarrhea. Differential diagnosis includes, but is not limited to, ovarian cyst, ovarian torsion, acute appendicitis, diverticulitis, urinary tract infection/pyelonephritis, endometriosis, bowel obstruction, colitis, renal colic, gastroenteritis, hernia, etc. I have personally reviewed patient's records and note her urgent care visit from 02/28/2023.  Patient's presentation is most consistent with acute presentation with potential threat to life or bodily function.  Laboratory results demonstrate normal WBC 6.7, normal electrolytes, negative UA.  Will obtain stool sample if patient is able, replete potassium, initiate IV fluid hydration and proceed with CT abdomen/pelvis.  Clinical Course as of 03/03/23 0349  Mon Mar 03, 2023  0155 Patient resting in no acute distress, voices no complaints at this time.  Updated her of unremarkable CT scan and negative C. difficile.  Bio fire pending.  We discussed risk/benefits of putting her on a short course of Cipro.  She would like to do that.  Will prescribe 3-day course of Cipro.  She will continue to drink Pedialyte and eat bananas.  Strict  return precautions given.  Patient verbalizes understanding and  agrees with plan of care. [JS]  0349 Addendum on chart review: Patient tested positive for rotavirus [JS]    Clinical Course User Index [JS] Irean HongSung, Suzana Sohail J, MD     FINAL CLINICAL IMPRESSION(S) / ED DIAGNOSES   Final diagnoses:  Diarrhea, unspecified type  Hypokalemia     Rx / DC Orders   ED Discharge Orders          Ordered    ciprofloxacin (CIPRO) 500 MG tablet  2 times daily        03/03/23 0157             Note:  This document was prepared using Dragon voice recognition software and may include unintentional dictation errors.   Irean HongSung, Jermaine Neuharth J, MD 03/03/23 [phone]

## 2023-03-02 NOTE — ED Notes (Signed)
Patient transported to CT 

## 2023-03-02 NOTE — ED Triage Notes (Signed)
Pt to ED POV for diarrhea 7 times per day, light yellow thin consistency since Tuesday. Went to UC for same 2 days ago. Was taking loperamide at one point which did not help. Denies pain. Denies nausea, vomiting.

## 2023-03-03 ENCOUNTER — Emergency Department: Payer: Medicare HMO

## 2023-03-03 DIAGNOSIS — R197 Diarrhea, unspecified: Secondary | ICD-10-CM | POA: Diagnosis not present

## 2023-03-03 LAB — GASTROINTESTINAL PANEL BY PCR, STOOL (REPLACES STOOL CULTURE)

## 2023-03-03 LAB — C DIFFICILE QUICK SCREEN W PCR REFLEX
C Diff antigen: NEGATIVE
C Diff interpretation: NOT DETECTED
C Diff toxin: NEGATIVE

## 2023-03-03 MED ORDER — CIPROFLOXACIN HCL 500 MG PO TABS
500.0000 mg | ORAL_TABLET | Freq: Once | ORAL | Status: AC
  Administered 2023-03-03: 500 mg via ORAL
  Filled 2023-03-03: qty 1

## 2023-03-03 MED ORDER — CIPROFLOXACIN HCL 500 MG PO TABS: 500.0000 mg | ORAL_TABLET | Freq: Two times a day (BID) | ORAL | 0 refills | Status: AC

## 2023-03-03 NOTE — Discharge Instructions (Signed)
You may find results of your stool studies via MyChart. Take antibiotic as prescribed until finished. Return to the ER for worsening symptoms, persistent vomiting, difficulty breathing or other concerns.

## 2023-06-12 ENCOUNTER — Emergency Department: Payer: Medicare HMO

## 2023-06-12 ENCOUNTER — Other Ambulatory Visit: Payer: Self-pay

## 2023-06-12 ENCOUNTER — Emergency Department
Admission: EM | Admit: 2023-06-12 | Discharge: 2023-06-12 | Disposition: A | Discharge status: Home / Self Care | Payer: Medicare HMO | Attending: Emergency Medicine | Admitting: Emergency Medicine

## 2023-06-12 DIAGNOSIS — J45909 Unspecified asthma, uncomplicated: Secondary | ICD-10-CM | POA: Diagnosis not present

## 2023-06-12 DIAGNOSIS — I10 Essential (primary) hypertension: Secondary | ICD-10-CM | POA: Insufficient documentation

## 2023-06-12 DIAGNOSIS — M545 Low back pain, unspecified: Secondary | ICD-10-CM | POA: Diagnosis not present

## 2023-06-12 LAB — URINALYSIS, ROUTINE W REFLEX MICROSCOPIC
Bilirubin Urine: NEGATIVE
Glucose, UA: NEGATIVE mg/dL
Ketones, ur: NEGATIVE mg/dL
Leukocytes,Ua: NEGATIVE
Nitrite: NEGATIVE
Protein, ur: NEGATIVE mg/dL
Specific Gravity, Urine: 1.006 (ref 1.005–1.030)
pH: 7 (ref 5.0–8.0)

## 2023-06-12 MED ORDER — MELOXICAM 15 MG PO TABS: 15.0000 mg | ORAL_TABLET | Freq: Every day | ORAL | 0 refills | Status: AC

## 2023-06-12 MED ORDER — KETOROLAC TROMETHAMINE 30 MG/ML IJ SOLN
30.0000 mg | Freq: Once | INTRAMUSCULAR | Status: AC
  Administered 2023-06-12: 30 mg via INTRAMUSCULAR
  Filled 2023-06-12: qty 1

## 2023-06-12 NOTE — Discharge Instructions (Addendum)
Call make an appointment with your new primary care provider if any continued problems.  Also a list of offices in [place] are listed on your discharge papers if needed. A prescription for meloxicam was sent to the pharmacy for you begin taking 1 daily with food.  Discontinue taking this medication if there is any stomach upset.  You may also use ice or heat to your back as needed for discomfort.  Discontinue taking the ibuprofen at home.  Please go to the following website to schedule new (and existing) patient appointments:   [email or web address]   The following is a list of primary care offices in the area who are accepting new patients at this time.  Please reach out to one of them directly and let them know you would like to schedule an appointment to follow up on an Emergency Department visit, and/or to establish a new primary care provider (PCP).  There are likely other primary care clinics in the are who are accepting new patients, but this is an excellent place to start:  [facility] Lead physician: Dr Shirlee Latch [address] [place], [place] [address] [phone]  [facility] Lead Physician: Dr Alba Cory [address], [place], [place] [address] [phone]  [facility]  Lead Physician: Dr Olevia Perches [address] [place], [place] [address] [phone]  [facility] Lead Physician: Dr Sofie Hartigan [address] [place], [place] [address] [phone]  [facility] Lead Physician: Dr Bari Edward [address] [place], [place], [place] [address] [phone]

## 2023-06-12 NOTE — ED Provider Notes (Signed)
[facility] Provider Note    Event Date/Time   First MD Initiated Contact with Patient 06/12/23 0900     (approximate)   History   Back Pain   HPI  Selena Walker is a 67 y.o. female presents to the ED with complaint of low back pain for 1 week without history of injury.  Patient denies any urinary symptoms or incontinence of bowel or bladder, no radiculopathy.  Patient has been taking ibuprofen 2 tablets every 6 hours with minimal relief.  Patient has a history of asthma, GERD, hypertension, glaucoma, sleep apnea, TIA.     Physical Exam   Triage Vital Signs: ED Triage Vitals  Encounter Vitals Group     BP 06/12/23 0900 (!) 145/81     Systolic BP Percentile --      Diastolic BP Percentile --      Pulse Rate 06/12/23 0900 92     Resp 06/12/23 0900 18     Temp 06/12/23 0858 (!) 97.5 F (36.4 C)     Temp src --      SpO2 06/12/23 0900 97 %     Weight 06/12/23 0858 175 lb (79.4 kg)     Height 06/12/23 0858 5\' 6"  (1.676 m)     Head Circumference --      Peak Flow --      Pain Score 06/12/23 0858 8     Pain Loc --      Pain Education --      Exclude from Growth Chart --     Most recent vital signs: Vitals:   06/12/23 0858 06/12/23 0900  BP:  (!) 145/81  Pulse:  92  Resp:  18  Temp: (!) 97.5 F (36.4 C)   SpO2:  97%     General: Awake, no distress.  CV:  Good peripheral perfusion.  Heart regular rate rhythm. Resp:  Normal effort.  Lungs are clear. Abd:  No distention.  Other:  Tenderness is noted on palpation of the lower lumbar L5-S1 area and paravertebral muscles bilaterally.  Patient is ambulatory without any assistance.  Good muscle strength at 5/5.   ED Results / Procedures / Treatments   Labs (all labs ordered are listed, but only abnormal results are displayed) Labs Reviewed  URINALYSIS, ROUTINE W REFLEX MICROSCOPIC - Abnormal; Notable for the following components:      Result Value   Color, Urine YELLOW (*)    APPearance  CLEAR (*)    Hgb urine dipstick SMALL (*)    Bacteria, UA RARE (*)    All other components within normal limits     RADIOLOGY Lumbar spine x-ray images were reviewed by myself independent of the radiologist and no compression fracture noted.  Show radiology report indicates slight increased narrowing of L5-S1 area.    PROCEDURES:  Critical Care performed:   Procedures   MEDICATIONS ORDERED IN ED: Medications  ketorolac (TORADOL) 30 MG/ML injection 30 mg (30 mg Intramuscular Given 06/12/23 1040)     IMPRESSION / MDM / ASSESSMENT AND PLAN / ED COURSE  I reviewed the triage vital signs and the nursing notes.   Differential diagnosis includes, but is not limited to, low back pain, low back strain, compression fracture, musculoskeletal pain, spondylolithiasis, degenerative disc disease, urinary tract infection, urolithiasis.  67 year old female presents to the ED with complaint of low back pain for 1 week without history of injury.  Patient was made aware that her urinalysis was negative and her  lumbar spine x-rays showed some minimal increasing disc space narrowing.  An injection of Toradol was given to the patient while she was in the emergency department.  We discussed beginning meloxicam 15 mg 1 daily.  She is encouraged to use ice or heat to her low back as needed for discomfort.  She is in the midst of having an assigned new PCP as her current one has left the practice.  She was also given a list of PCPs in the [place] area should she need it.      Patient's presentation is most consistent with acute complicated illness / injury requiring diagnostic workup.  FINAL CLINICAL IMPRESSION(S) / ED DIAGNOSES   Final diagnoses:  Acute midline low back pain without sciatica     Rx / DC Orders   ED Discharge Orders          Ordered    meloxicam (MOBIC) 15 MG tablet  Daily        06/12/23 1019             Note:  This document was prepared using Dragon voice  recognition software and may include unintentional dictation errors.   Tommi Rumps, PA-C 06/12/23 1104    Sharman Cheek, MD 06/13/23 [phone]

## 2023-06-12 NOTE — ED Triage Notes (Signed)
Pt to ED for back pain x1 week. Denies injuries.

## 2023-06-12 NOTE — ED Notes (Signed)
See triage note  Presents with lower back pain which started about 1 week ago  States pain is in lower back and is non radiating   Ambulates well  Min relief with OTC ibu

## 2023-08-28 IMAGING — CT CT ANGIO CHEST
2 of 10 series · 18 of 46 positions shown · IV contrast (agent unspecified)
Comparison: CT 04/16/2018, mammogram 10/23/2020, 09/23/2016

CLINICAL DATA: Tachycardia short of breath

EXAM:
CT ANGIOGRAPHY CHEST WITH CONTRAST
TECHNIQUE: Multidetector CT imaging of the chest was performed using the
standard protocol during bolus administration of intravenous
contrast. Multiplanar CT image reconstructions and MIPs were
obtained to evaluate the vascular anatomy.

[Series 9: arterial thins · axial · arterial · 0.73mm/px · z∈[-140,+136]mm · 15 of 395 slices shown]
[im 25/395  lung]
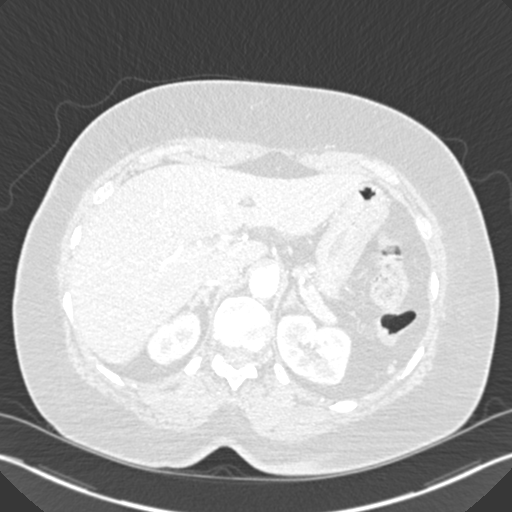
[im 50/395  soft-tissue]
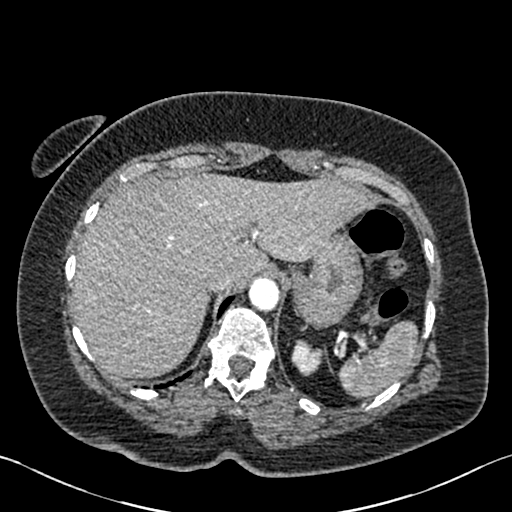
[im 74/395  lung]
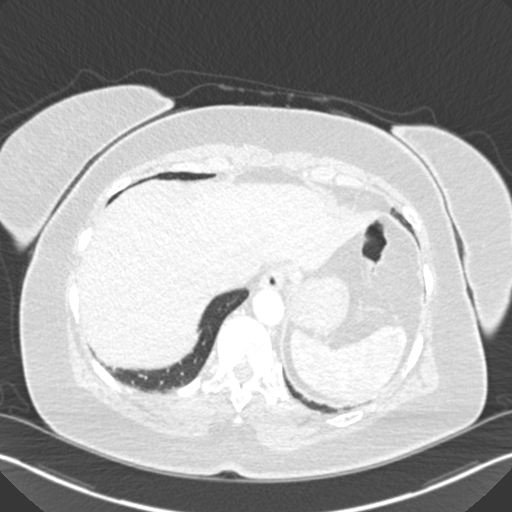
[im 99/395  soft-tissue]
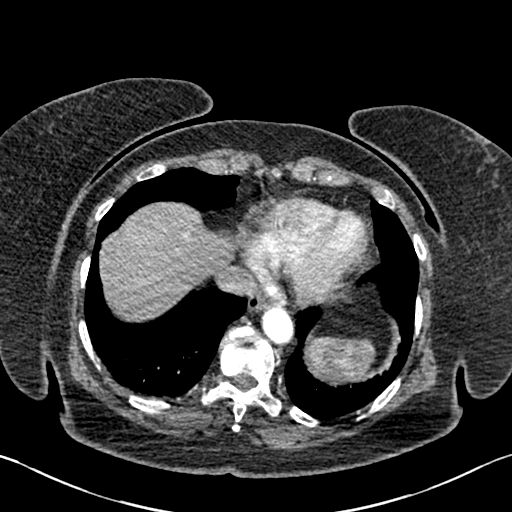
[im 124/395  lung]
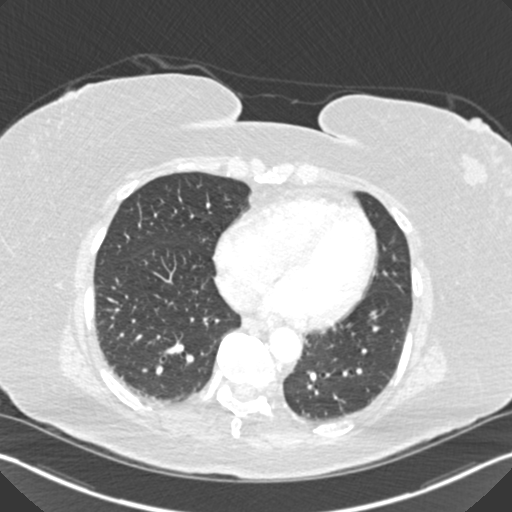
[im 148/395  soft-tissue]
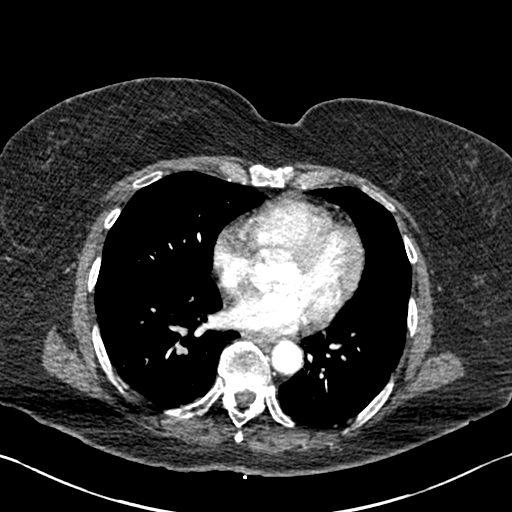
[im 173/395  lung]
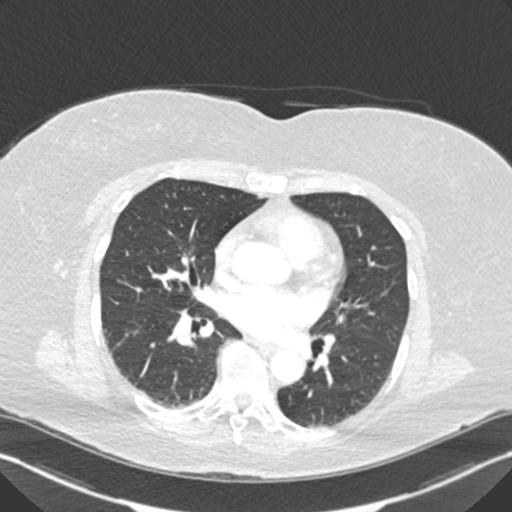
[im 198/395  soft-tissue]
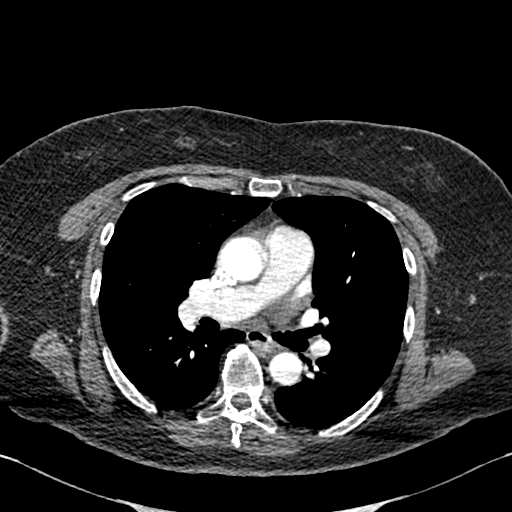
[im 222/395  lung]
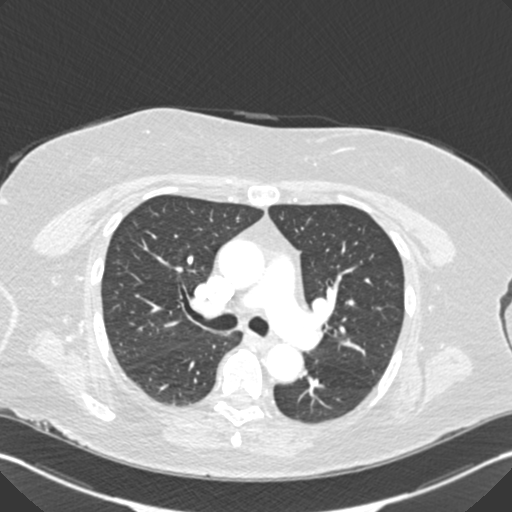
[im 247/395  soft-tissue]
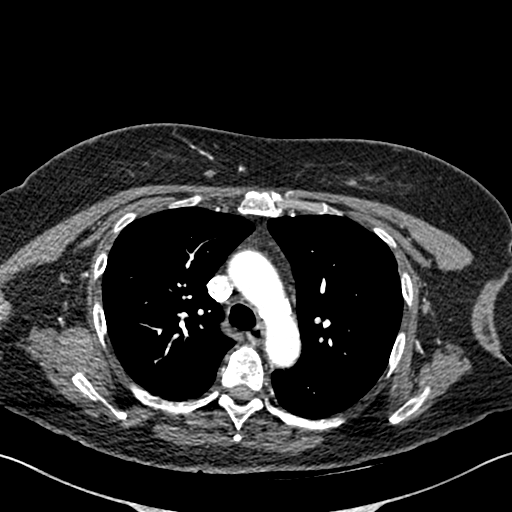
[im 271/395  lung]
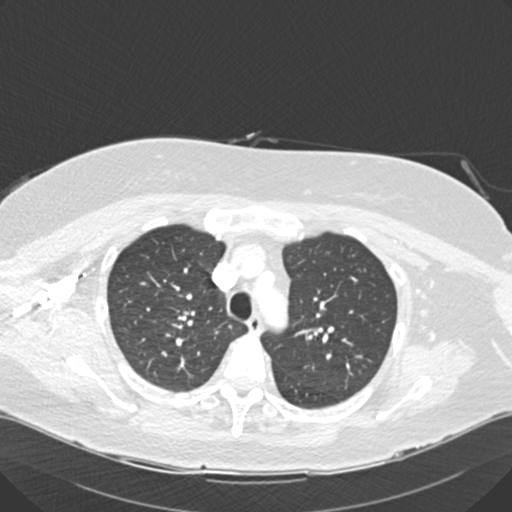
[im 296/395  soft-tissue]
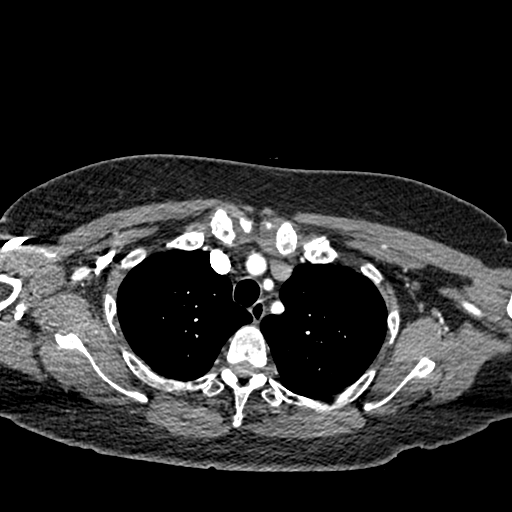
[im 321/395  lung]
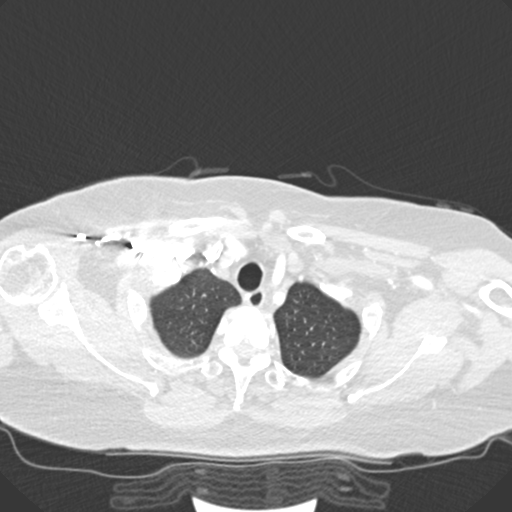
[im 345/395  soft-tissue]
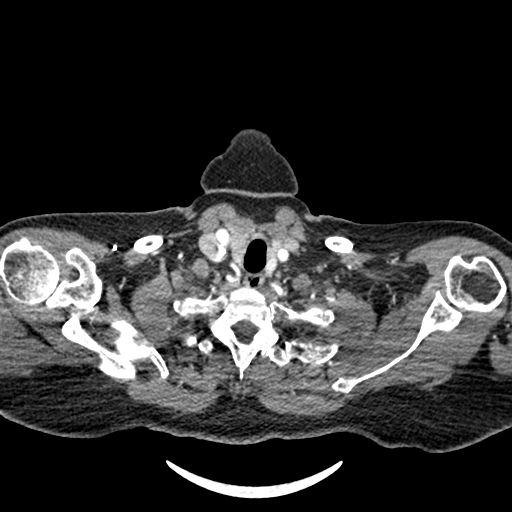
[im 370/395  lung]
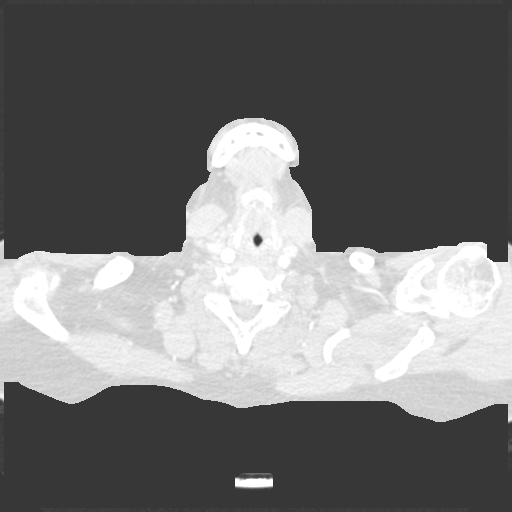

[Series 10: coronals · coronal · 0.66mm/px · 3 of 125 slices shown]
[im 32/125  soft-tissue]
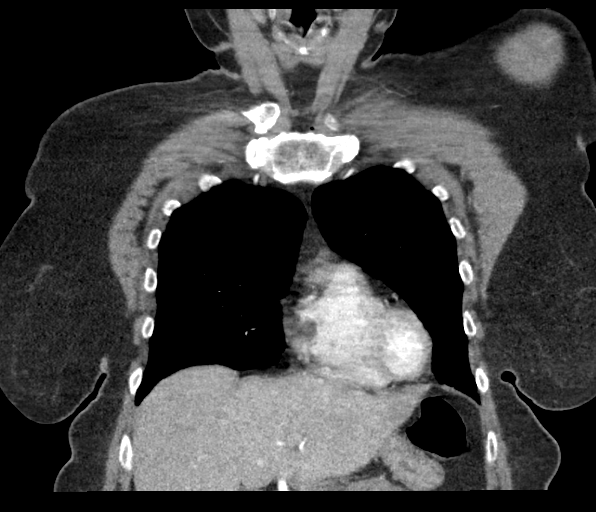
[im 63/125  soft-tissue]
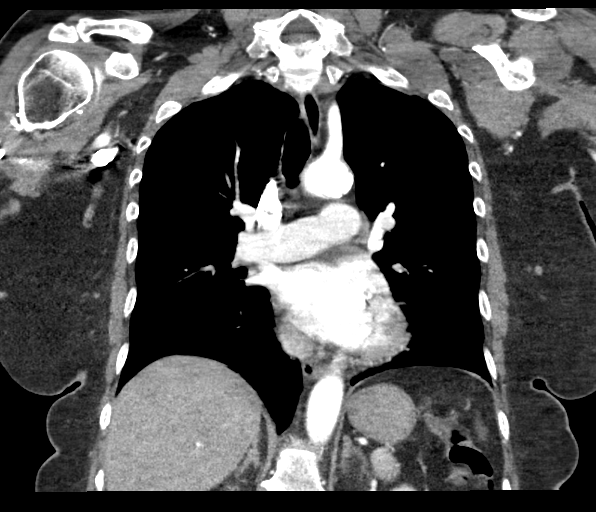
[im 94/125  soft-tissue]
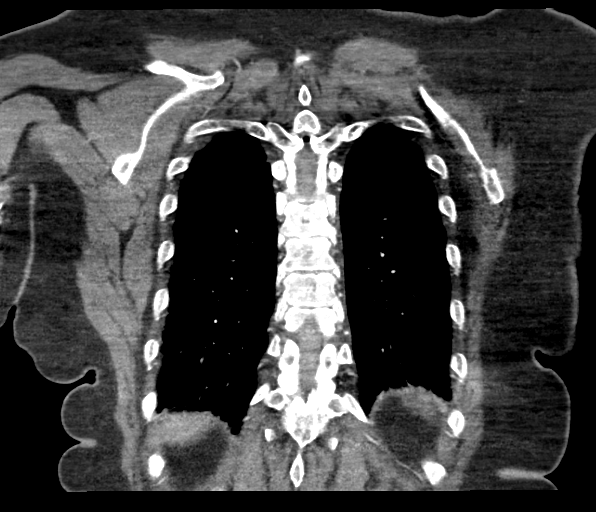

[18 of 46 positions shown; findings below may reference images not displayed]

RADIATION DOSE REDUCTION: This exam was performed according to the
departmental dose-optimization program which includes automated
exposure control, adjustment of the mA and/or kV according to
patient size and/or use of iterative reconstruction technique.

CONTRAST:  75mL OMNIPAQUE IOHEXOL 350 MG/ML SOLN
FINDINGS: Cardiovascular: Non contrasted images of the chest demonstrate no
acute intramural hematoma. Mild aortic atherosclerosis. Aorta is
nonaneurysmal. No dissection is seen. Normal cardiac size. No
pericardial effusion

Mediastinum/Nodes: Midline trachea. No thyroid mass. No suspicious
lymph nodes. Esophagus within normal limits.

Lungs/Pleura: Lungs are clear. No pleural effusion or pneumothorax.

Upper Abdomen: No acute abnormality.

Musculoskeletal: No chest wall abnormality. No acute or significant
osseous findings. Left breast nodularity is stable as compared with
multiple prior exams including mammography.

Review of the MIP images confirms the above findings.
IMPRESSION: 1. Negative for aortic dissection or aortic aneurysm. Though not
tailored for this, no gross central filling defects are seen within
the central pulmonary arterial system
2. Clear lung fields

Aortic Atherosclerosis ([id number]-[id number].O).

## 2023-08-28 IMAGING — CT CT NECK W/ CM
4 of 5 series · 14 of 33 positions shown, 16 images · IV contrast (APPLIED)
Comparison: None.

CLINICAL DATA: Initial evaluation for acute sore throat.

EXAM:
CT NECK WITH CONTRAST
TECHNIQUE: Multidetector CT imaging of the neck was performed using the
standard protocol following the bolus administration of intravenous
contrast.

[Series 5: axial neck · axial · 0.59mm/px · z∈[+107,+181]mm · 2 of 112 slices shown]
[im 38/112  bone]
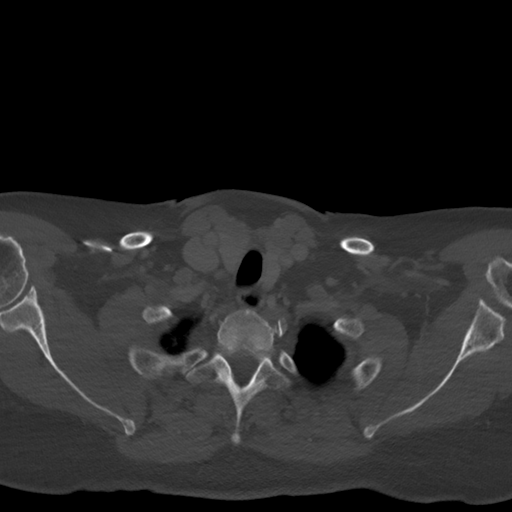
[im 75/112  bone]
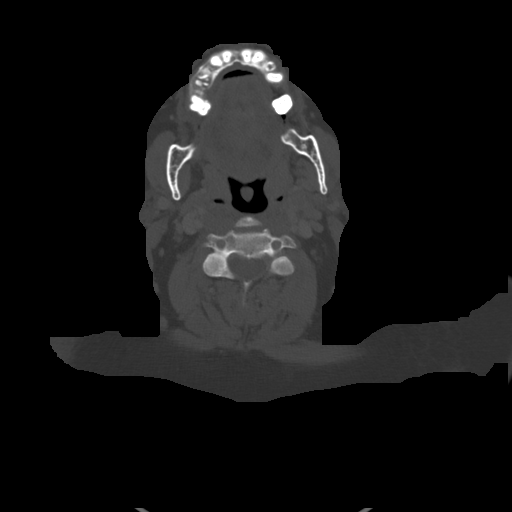

[Series 15: sag neck · sagittal · 0.52mm/px · 5 of 125 slices shown, 6 images]
[im 42/125  bone]
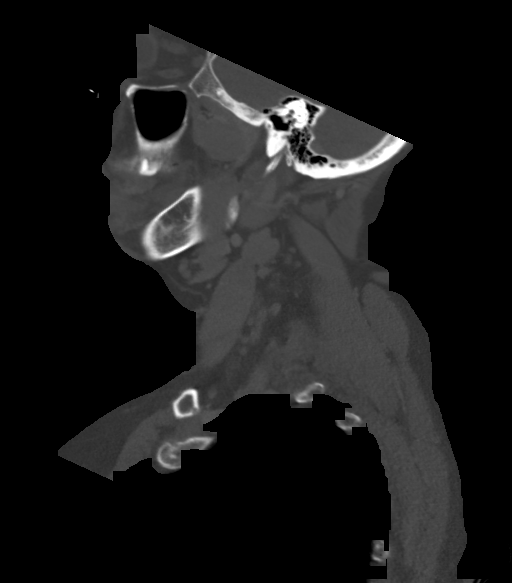
[im 52/125  bone]
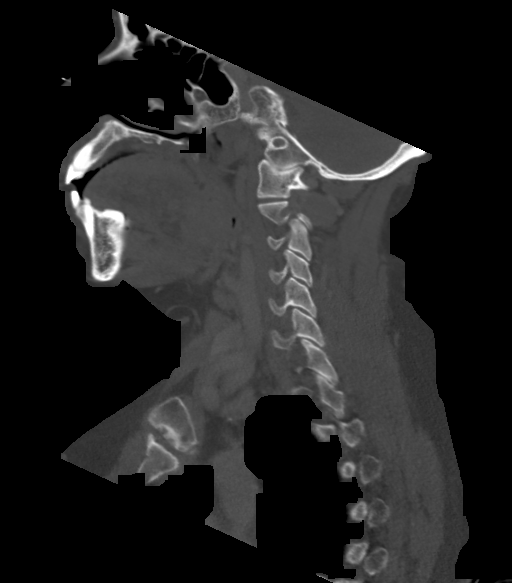
[im 63/125  soft-tissue]
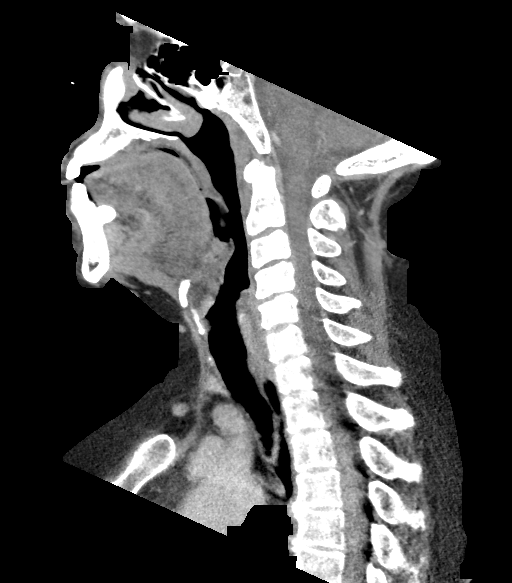
[im 63/125  bone]
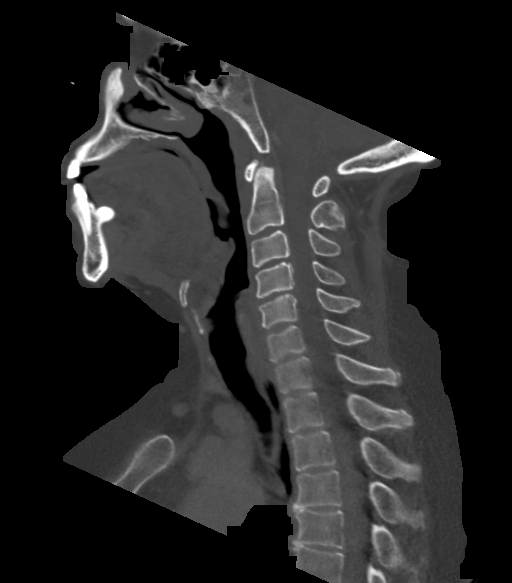
[im 73/125  bone]
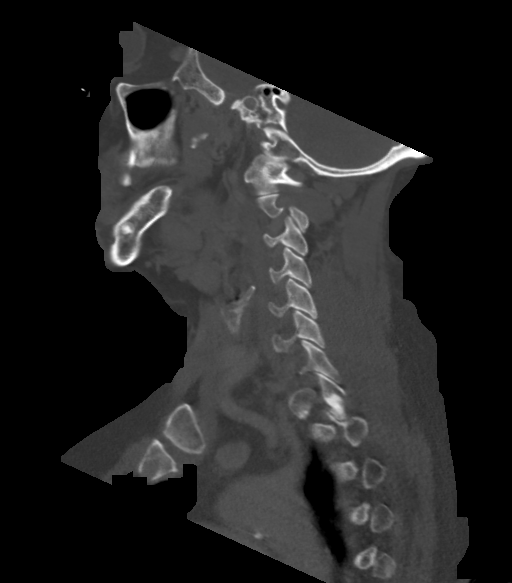
[im 83/125  bone]
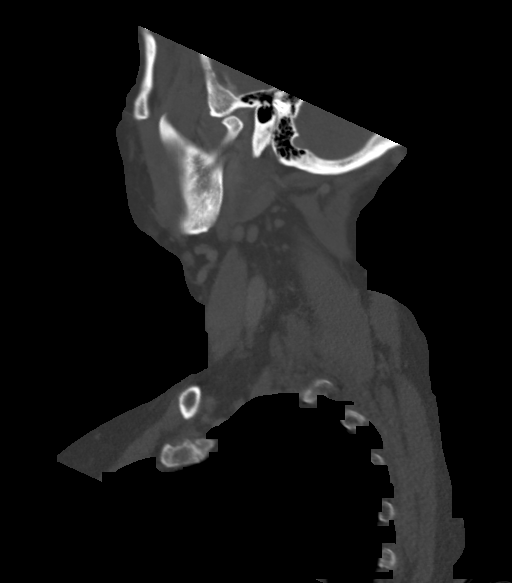

[Series 16: cor neck · coronal · 0.49mm/px · 3 of 115 slices shown]
[im 40/115  bone]
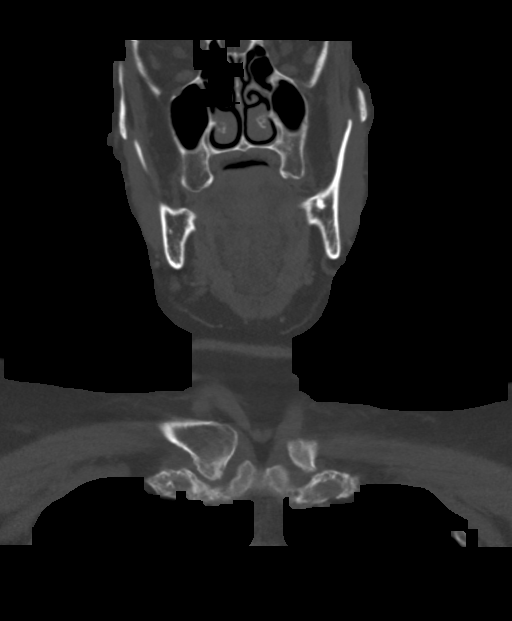
[im 52/115  bone]
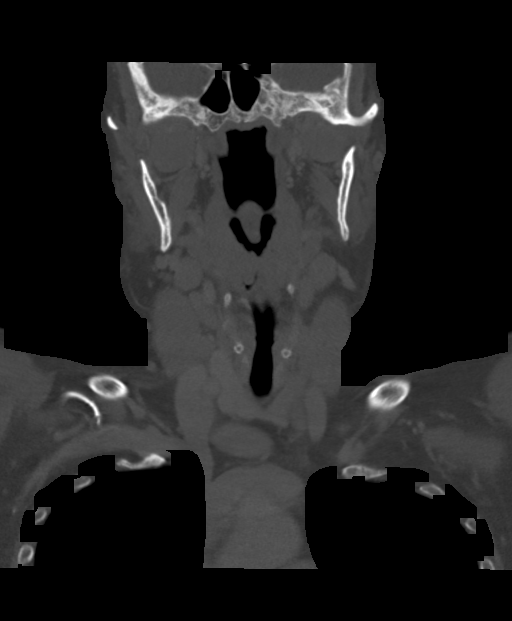
[im 64/115  bone]
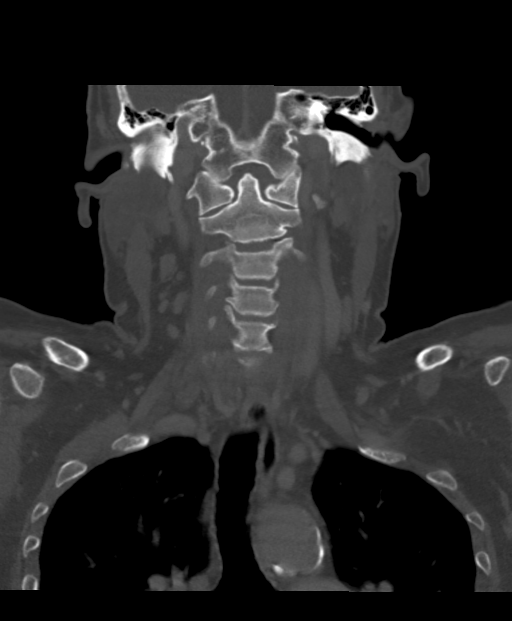

[Series 17: orthogonal (person_name) · axial · 0.49mm/px · z∈[+10,+172]mm · 4 of 149 slices shown, 5 images]
[im 30/149  soft-tissue]
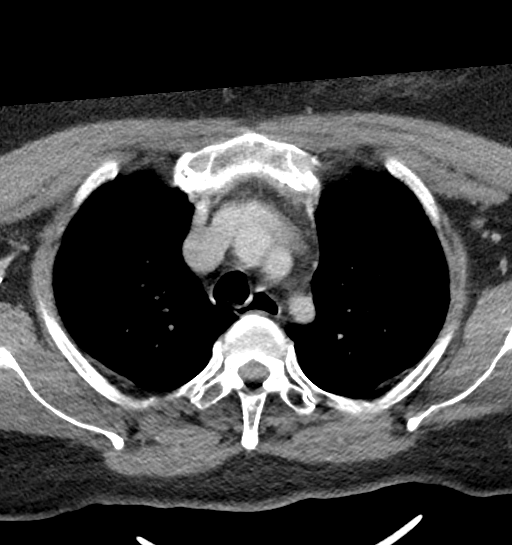
[im 30/149  bone]
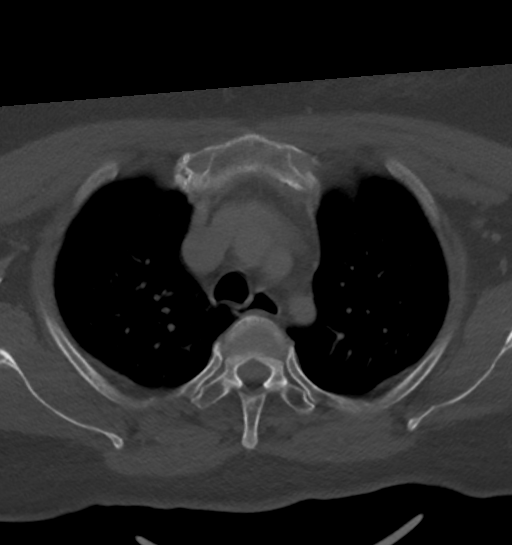
[im 60/149  bone]
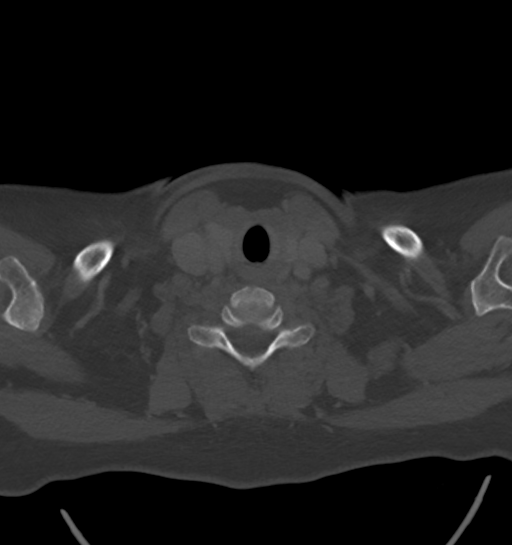
[im 89/149  bone]
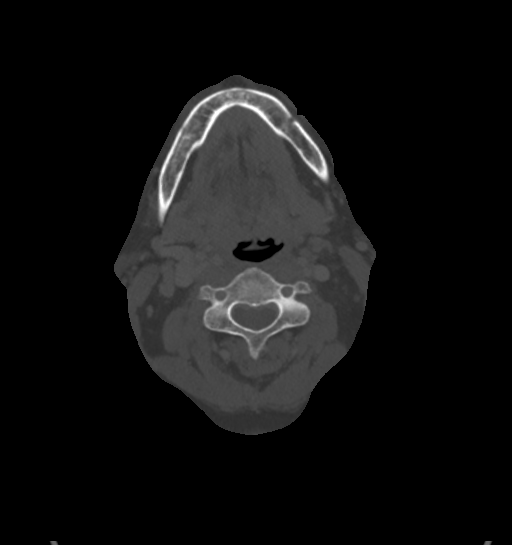
[im 119/149  bone]
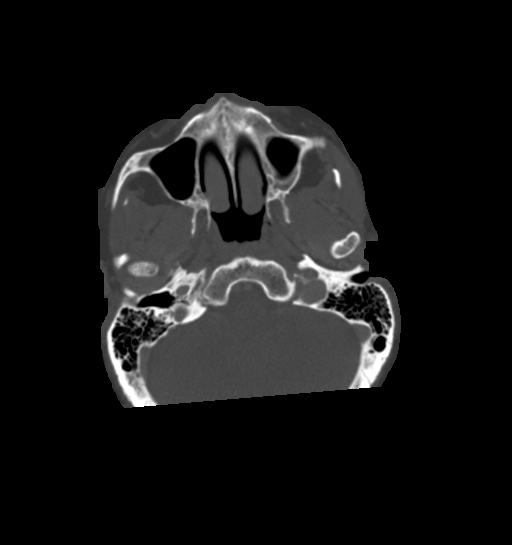

[14 of 33 positions shown; findings below may reference images not displayed]

RADIATION DOSE REDUCTION: This exam was performed according to the
departmental dose-optimization program which includes automated
exposure control, adjustment of the mA and/or kV according to
patient size and/or use of iterative reconstruction technique.

CONTRAST:  75mL OMNIPAQUE IOHEXOL 350 MG/ML SOLN
FINDINGS: Pharynx and larynx: Oral cavity within normal limits. No acute
inflammatory changes seen about the dentition. Palatine tonsils
symmetric and within normal limits. Oropharynx and nasopharynx
normal. No retropharyngeal collection or swelling. Epiglottis within
normal limits. Hypopharynx and supraglottic larynx normal. Glottis
normal. Subglottic airway patent clear.

Salivary glands: Parotid and submandibular glands are within normal
limits.

Thyroid: Normal.

Lymph nodes: No enlarged or pathologic adenopathy.

Vascular: Normal intravascular enhancement seen throughout the neck.
Atheromatous change seen about the aortic arch and carotid siphons.

Limited intracranial: Unremarkable.

Visualized orbits: Unremarkable.

Mastoids and visualized paranasal sinuses: Visualized paranasal
sinuses are clear. Visualized mastoids and middle ear cavities are
well pneumatized and free of fluid.

Skeleton: No discrete or worrisome osseous lesions.

Upper chest: 5 mm nodular density noted along the right major
fissure, favored to reflect a small intraparotid lymph node.
Visualized upper chest demonstrates no other acute finding.

Other: Few scattered nodular lesions present about the skin of the
right greater than left face, largest of which measures
approximately 1.6 cm on the right (series 5, image 24). Findings are
indeterminate, likely benign.
IMPRESSION: 1. Negative CT of the neck. No acute inflammatory changes or other
abnormality identified.
2.  Aortic Atherosclerosis ([id number]).
3. Few scattered nodular lesions about the skin of the right greater
than left face, indeterminate, but likely benign. Correlation with
physical exam recommended.

## 2023-12-05 ENCOUNTER — Other Ambulatory Visit: Payer: Self-pay | Admitting: Physician Assistant

## 2023-12-05 DIAGNOSIS — Z1231 Encounter for screening mammogram for malignant neoplasm of breast: Secondary | ICD-10-CM

## 2024-02-18 ENCOUNTER — Ambulatory Visit
Admission: RE | Admit: 2024-02-18 | Discharge: 2024-02-18 | Disposition: A | Discharge status: Home / Self Care | Source: Ambulatory Visit | Attending: Physician Assistant | Admitting: Physician Assistant

## 2024-02-18 DIAGNOSIS — Z1231 Encounter for screening mammogram for malignant neoplasm of breast: Secondary | ICD-10-CM | POA: Diagnosis present

## 2024-03-11 ENCOUNTER — Other Ambulatory Visit: Payer: Self-pay | Admitting: Obstetrics and Gynecology

## 2024-03-11 DIAGNOSIS — N63 Unspecified lump in unspecified breast: Secondary | ICD-10-CM

## 2024-03-15 ENCOUNTER — Inpatient Hospital Stay
Admission: RE | Admit: 2024-03-15 | Discharge: 2024-03-15 | Disposition: A | Discharge status: Home / Self Care | Payer: Self-pay | Source: Ambulatory Visit | Attending: Obstetrics and Gynecology | Admitting: Obstetrics and Gynecology

## 2024-03-15 ENCOUNTER — Other Ambulatory Visit: Payer: Self-pay | Admitting: *Deleted

## 2024-03-15 DIAGNOSIS — Z1231 Encounter for screening mammogram for malignant neoplasm of breast: Secondary | ICD-10-CM

## 2024-03-17 ENCOUNTER — Ambulatory Visit
Admission: RE | Admit: 2024-03-17 | Discharge: 2024-03-17 | Disposition: A | Discharge status: Home / Self Care | Source: Ambulatory Visit | Attending: Obstetrics and Gynecology | Admitting: Obstetrics and Gynecology

## 2024-03-17 DIAGNOSIS — N6342 Unspecified lump in left breast, subareolar: Secondary | ICD-10-CM | POA: Insufficient documentation

## 2024-03-17 DIAGNOSIS — N63 Unspecified lump in unspecified breast: Secondary | ICD-10-CM

## 2024-04-05 ENCOUNTER — Other Ambulatory Visit: Payer: Self-pay

## 2024-04-05 ENCOUNTER — Emergency Department
Admission: EM | Admit: 2024-04-05 | Discharge: 2024-04-05 | Disposition: A | Discharge status: Home / Self Care | Attending: Emergency Medicine | Admitting: Emergency Medicine

## 2024-04-05 ENCOUNTER — Emergency Department

## 2024-04-05 DIAGNOSIS — Z8673 Personal history of transient ischemic attack (TIA), and cerebral infarction without residual deficits: Secondary | ICD-10-CM | POA: Insufficient documentation

## 2024-04-05 DIAGNOSIS — J181 Lobar pneumonia, unspecified organism: Secondary | ICD-10-CM | POA: Diagnosis not present

## 2024-04-05 DIAGNOSIS — J189 Pneumonia, unspecified organism: Secondary | ICD-10-CM

## 2024-04-05 DIAGNOSIS — R0789 Other chest pain: Secondary | ICD-10-CM | POA: Diagnosis present

## 2024-04-05 LAB — BASIC METABOLIC PANEL WITH GFR
Anion gap: 9 (ref 5–15)
BUN: 13 mg/dL (ref 8–23)
CO2: 32 mmol/L (ref 22–32)
Calcium: 9.7 mg/dL (ref 8.9–10.3)
Chloride: 100 mmol/L (ref 98–111)
Creatinine, Ser: 0.79 mg/dL (ref 0.44–1.00)
GFR, Estimated: >60 mL/min (ref >60)
Glucose, Bld: 120 mg/dL — ABNORMAL HIGH (ref 70–99)
Potassium: 4 mmol/L (ref 3.5–5.1)
Sodium: 141 mmol/L (ref 135–145)

## 2024-04-05 LAB — TROPONIN I (HIGH SENSITIVITY): Troponin I (High Sensitivity): 4 ng/L (ref <18)

## 2024-04-05 LAB — CBC
HCT: 40.6 % (ref 36.0–46.0)
Hemoglobin: 12.5 g/dL (ref 12.0–15.0)
MCH: 26.4 pg (ref 26.0–34.0)
MCHC: 30.8 g/dL (ref 30.0–36.0)
MCV: 85.7 fL (ref 80.0–100.0)
Platelets: 382 10*3/uL (ref 150–400)
RBC: 4.74 MIL/uL (ref 3.87–5.11)
RDW: 13.3 % (ref 11.5–15.5)
WBC: 8.8 10*3/uL (ref 4.0–10.5)
nRBC: 0 % (ref 0.0–0.2)

## 2024-04-05 MED ORDER — CEPHALEXIN 500 MG PO CAPS: 500.0000 mg | ORAL_CAPSULE | Freq: Three times a day (TID) | ORAL | 0 refills | Status: AC

## 2024-04-05 MED ORDER — AZITHROMYCIN 250 MG PO TABS: ORAL_TABLET | ORAL | 0 refills | Status: AC

## 2024-04-05 NOTE — ED Triage Notes (Signed)
 Pt comes mid sternal cp and sob that started today. Pt states 9/10 pain. Pt states N/V

## 2024-04-05 NOTE — ED Provider Notes (Signed)
 [facility] Provider Note    Event Date/Time   First MD Initiated Contact with Patient 04/05/24 1258     (approximate)   History   Chest Pain   HPI  Selena Walker is a 68 y.o. female with a history of GERD, asthma, TIA who presents with complaints of left lower chest discomfort.  She reports she has had a wet cough for 3 days and has developed discomfort in her left lower chest after coughing.  No fevers reported.  Positive sick contact last week.  No calf pain or swelling.  No recent travel.     Physical Exam   Triage Vital Signs: ED Triage Vitals  Encounter Vitals Group     BP 04/05/24 1155 (!) 147/83     Systolic BP Percentile --      Diastolic BP Percentile --      Pulse Rate 04/05/24 1155 100     Resp 04/05/24 1155 18     Temp 04/05/24 1155 98 F (36.7 C)     Temp src --      SpO2 04/05/24 1155 99 %     Weight 04/05/24 1154 82.6 kg (182 lb)     Height 04/05/24 1154 1.727 m (5\' 8" )     Head Circumference --      Peak Flow --      Pain Score 04/05/24 1154 9     Pain Loc --      Pain Education --      Exclude from Growth Chart --     Most recent vital signs: Vitals:   04/05/24 1155 04/05/24 1300  BP: (!) 147/83 134/73  Pulse: 100 76  Resp: 18 17  Temp: 98 F (36.7 C)   SpO2: 99% 99%     General: Awake, no distress. CV:  Good peripheral perfusion.  Resp:  Normal effort.  Left lower lobe Rales, no wheezing Abd:  No distention.  Other:     ED Results / Procedures / Treatments   Labs (all labs ordered are listed, but only abnormal results are displayed) Labs Reviewed  BASIC METABOLIC PANEL WITH GFR - Abnormal; Notable for the following components:      Result Value   Glucose, Bld 120 (*)    All other components within normal limits  CBC  TROPONIN I (HIGH SENSITIVITY)     EKG     RADIOLOGY Chest x-ray viewed interpret by me, no clear pneumonia    PROCEDURES:  Critical Care performed:    Procedures   MEDICATIONS ORDERED IN ED: Medications - No data to display   IMPRESSION / MDM / ASSESSMENT AND PLAN / ED COURSE  I reviewed the triage vital signs and the nursing notes. Patient's presentation is most consistent with acute illness / injury with system symptoms.  Patient presents with cough as detailed above, differential includes viral URI versus early CAP.  Lab work reviewed and is reassuring, vital signs are reassuring.  Chest x-ray without clear evidence of CAP but given discomfort in the left lower chest, wet cough will start the patient on antibiotics, close follow-up with PCP, return precautions discussed, she agrees with this plan.        FINAL CLINICAL IMPRESSION(S) / ED DIAGNOSES   Final diagnoses:  Community acquired pneumonia of left lower lobe of lung     Rx / DC Orders   ED Discharge Orders          Ordered    cephALEXin (  KEFLEX) 500 MG capsule  3 times daily        04/05/24 1323    azithromycin (ZITHROMAX Z-PAK) 250 MG tablet        04/05/24 1323             Note:  This document was prepared using Dragon voice recognition software and may include unintentional dictation errors.   Bryson Carbine, MD 04/05/24 1520

## 2024-04-15 ENCOUNTER — Ambulatory Visit: Admitting: Urology

## 2024-05-14 ENCOUNTER — Ambulatory Visit: Admitting: Urology

## 2024-06-28 ENCOUNTER — Encounter: Payer: Self-pay | Admitting: Emergency Medicine

## 2024-06-28 ENCOUNTER — Emergency Department: Admission: EM | Admit: 2024-06-28 | Discharge: 2024-06-28 | Disposition: A | Discharge status: Home / Self Care

## 2024-06-28 ENCOUNTER — Other Ambulatory Visit: Payer: Self-pay

## 2024-06-28 DIAGNOSIS — Z8673 Personal history of transient ischemic attack (TIA), and cerebral infarction without residual deficits: Secondary | ICD-10-CM | POA: Insufficient documentation

## 2024-06-28 DIAGNOSIS — J45909 Unspecified asthma, uncomplicated: Secondary | ICD-10-CM | POA: Diagnosis not present

## 2024-06-28 DIAGNOSIS — I10 Essential (primary) hypertension: Secondary | ICD-10-CM | POA: Insufficient documentation

## 2024-06-28 DIAGNOSIS — L659 Nonscarring hair loss, unspecified: Secondary | ICD-10-CM | POA: Insufficient documentation

## 2024-06-28 DIAGNOSIS — R519 Headache, unspecified: Secondary | ICD-10-CM | POA: Diagnosis not present

## 2024-06-28 NOTE — ED Provider Notes (Signed)
 [facility] Emergency Department Provider Note     Event Date/Time   First MD Initiated Contact with Patient 06/28/24 1518     (approximate)   History   Scalp Pain   HPI  Selena Walker is a 68 y.o. female with a PMHx of HTN, GERD, asthma, HLD, TIA and meningioma presents to the ED with complaint of hair loss and scalp tenderness x 4 days localized to bilateral sides of her widows peak hairline. Described as an intermittent burning sensation. Denies new medications, injuries/trauma, weakness or pain at this time. She endorses recent personal stressors with caring for her brother. No other complaint.      Physical Exam   Triage Vital Signs: ED Triage Vitals  Encounter Vitals Group     BP 06/28/24 1449 (!) 150/110     Girls Systolic BP Percentile --      Girls Diastolic BP Percentile --      Boys Systolic BP Percentile --      Boys Diastolic BP Percentile --      Pulse Rate 06/28/24 1449 (!) 108     Resp 06/28/24 1449 18     Temp 06/28/24 1449 97.8 F (36.6 C)     Temp Source 06/28/24 1449 Oral     SpO2 06/28/24 1449 99 %     Weight 06/28/24 1448 165 lb (74.8 kg)     Height 06/28/24 1448 5' 7 (1.702 m)     Head Circumference --      Peak Flow --      Pain Score 06/28/24 1448 6     Pain Loc --      Pain Education --      Exclude from Growth Chart --     Most recent vital signs: Vitals:   06/28/24 1449  BP: (!) 150/110  Pulse: (!) 108  Resp: 18  Temp: 97.8 F (36.6 C)  SpO2: 99%    General Awake, no distress.  HEENT NCAT. Fine hair to bilateral widows peak of hairline.  Mild tenderness to palpation, otherwise appears normal.  CV:  Good peripheral perfusion.  RESP:  Normal effort.  ABD:  No distention.   ED Results / Procedures / Treatments   Labs (all labs ordered are listed, but only abnormal results are displayed) Labs Reviewed - No data to display  No results found.  PROCEDURES:  Critical Care performed:  No  Procedures  MEDICATIONS ORDERED IN ED: Medications - No data to display  IMPRESSION / MDM / ASSESSMENT AND PLAN / ED COURSE  I reviewed the triage vital signs and the nursing notes.                               68 y.o. female presents to the emergency department for evaluation and treatment of hair loss with localized scalp tenderness x 4 days. See HPI for further details.   Differential diagnosis includes, but is not limited to alopecia, tinea capitis, stress   Patient's presentation is most consistent with acute, uncomplicated illness.  Patient is alert and oriented.  She is hemodynamically stable and in no acute distress during my evaluation.  Presentation is suspicious for early alopecia however her onset of 4 days is too early to make this diagnosis.  Hair loss due to stress considered, but would not explain the tenderness.  Less likely tinea capitis given absence of pruritus.  Pain medication offered however  patient denied being that she is not in any pain at this time.  No further workup indicated at this time.  She reports a recent change in her insurance causing her not to have a primary care provider at this time. She is requesting resources for a PCP in [place] as she resides in [place].  A list was provided for her.  I have also provided her with a dermatology in [place] for her to follow-up with if symptoms continue.  I advised patient of warm compresses and Vaseline to the area.  We discussed of minoxidil OTC if hair loss continues.  Patient is in stable and satisfactory condition for discharge home.  ED return precaution discussed.   FINAL CLINICAL IMPRESSION(S) / ED DIAGNOSES   Final diagnoses:  Loss of hair  Scalp pain   Rx / DC Orders   ED Discharge Orders          Ordered    Ambulatory Referral to Primary Care (Establish Care)        06/28/24 1551            Note:  This document was prepared using Dragon voice recognition software and may include  unintentional dictation errors.    Margrette, Adesuwa Osgood A, PA-C 06/28/24 1653    Clarine Ozell LABOR, MD 06/29/24 [phone]

## 2024-06-28 NOTE — Discharge Instructions (Signed)
 Please follow up with your primary care provider. A referral has been sent for you. A list of offices in [place] have been added for you as well. You are encouraged to add a warm compress and Vaseline to the areas of hair loss for comfort. Take tylenol  for pain as needed.   Is symptoms persist, follow up with dermatology for further evaluation.

## 2024-06-28 NOTE — ED Triage Notes (Signed)
 Patient to ED via POV for hair loss and scalp tenderness x4 days. States she also has a lack of appetite.

## 2024-08-21 ENCOUNTER — Other Ambulatory Visit: Payer: Self-pay

## 2024-08-21 ENCOUNTER — Encounter: Payer: Self-pay | Admitting: *Deleted

## 2024-08-21 ENCOUNTER — Emergency Department
Admission: EM | Admit: 2024-08-21 | Discharge: 2024-08-21 | Disposition: A | Discharge status: Home / Self Care | Attending: Emergency Medicine | Admitting: Emergency Medicine

## 2024-08-21 DIAGNOSIS — R310 Gross hematuria: Secondary | ICD-10-CM | POA: Diagnosis not present

## 2024-08-21 DIAGNOSIS — I10 Essential (primary) hypertension: Secondary | ICD-10-CM | POA: Diagnosis not present

## 2024-08-21 DIAGNOSIS — R112 Nausea with vomiting, unspecified: Secondary | ICD-10-CM | POA: Diagnosis not present

## 2024-08-21 DIAGNOSIS — J45909 Unspecified asthma, uncomplicated: Secondary | ICD-10-CM | POA: Insufficient documentation

## 2024-08-21 DIAGNOSIS — R1031 Right lower quadrant pain: Secondary | ICD-10-CM | POA: Insufficient documentation

## 2024-08-21 DIAGNOSIS — M545 Low back pain, unspecified: Secondary | ICD-10-CM | POA: Insufficient documentation

## 2024-08-21 LAB — COMPREHENSIVE METABOLIC PANEL WITH GFR
ALT: 13 U/L (ref 0–44)
AST: 22 U/L (ref 15–41)
Albumin: 4.1 g/dL (ref 3.5–5.0)
Alkaline Phosphatase: 58 U/L (ref 38–126)
Anion gap: 7 (ref 5–15)
BUN: 12 mg/dL (ref 8–23)
CO2: 27 mmol/L (ref 22–32)
Calcium: 9.3 mg/dL (ref 8.9–10.3)
Chloride: 106 mmol/L (ref 98–111)
Creatinine, Ser: 0.75 mg/dL (ref 0.44–1.00)
GFR, Estimated: >60 mL/min (ref >60)
Glucose, Bld: 104 mg/dL — ABNORMAL HIGH (ref 70–99)
Potassium: 3.8 mmol/L (ref 3.5–5.1)
Sodium: 140 mmol/L (ref 135–145)
Total Bilirubin: 0.6 mg/dL (ref 0.0–1.2)
Total Protein: 7.6 g/dL (ref 6.5–8.1)

## 2024-08-21 LAB — URINALYSIS, ROUTINE W REFLEX MICROSCOPIC
Bacteria, UA: NONE SEEN
Bilirubin Urine: NEGATIVE
Glucose, UA: NEGATIVE mg/dL
Hgb urine dipstick: NEGATIVE
Ketones, ur: NEGATIVE mg/dL
Nitrite: NEGATIVE
Protein, ur: NEGATIVE mg/dL
Specific Gravity, Urine: 1.001 — ABNORMAL LOW (ref 1.005–1.030)
pH: 7 (ref 5.0–8.0)

## 2024-08-21 LAB — CBC
HCT: 36.9 % (ref 36.0–46.0)
Hemoglobin: 11.9 g/dL — ABNORMAL LOW (ref 12.0–15.0)
MCH: 27.1 pg (ref 26.0–34.0)
MCHC: 32.2 g/dL (ref 30.0–36.0)
MCV: 84.1 fL (ref 80.0–100.0)
Platelets: 371 K/uL (ref 150–400)
RBC: 4.39 MIL/uL (ref 3.87–5.11)
RDW: 14.2 % (ref 11.5–15.5)
WBC: 8.5 K/uL (ref 4.0–10.5)
nRBC: 0 % (ref 0.0–0.2)

## 2024-08-21 LAB — LIPASE, BLOOD: Lipase: 31 U/L (ref 11–51)

## 2024-08-21 MED ORDER — ONDANSETRON 4 MG PO TBDP: 4.0000 mg | ORAL_TABLET | Freq: Three times a day (TID) | ORAL | 0 refills | Status: AC | PRN

## 2024-08-21 NOTE — ED Notes (Signed)
 Patient attempting PO challenge

## 2024-08-21 NOTE — ED Provider Notes (Signed)
 [facility] Provider Note   Event Date/Time   First MD Initiated Contact with Patient 08/21/24 2014     (approximate) History  Abdominal Pain  HPI Selena Walker is a 68 y.o. female with a past medical history of hypertension, asthma, migraines, TIA, and hyperlipidemia who presents complaining of right lower quadrant abdominal pain with right lower back pain and associated hematuria and emesis.  Patient states that this pain started in the right lower back after moving heavy furniture recently.  Patient states that this pain is radiating around to the right lower quadrant and was associated with an episode of bloody urination.  Patient states that the next time she had to urinate it was clear without any evidence of dark blood.  Patient states that the vomiting was without blood as well.  Patient states that the symptoms lasted approximately 2 hours and have resolved spontaneously without taking medications.  Patient denies any complaints at this time ROS: Patient currently denies any vision changes, tinnitus, difficulty speaking, facial droop, sore throat, chest pain, shortness of breath, nausea/vomiting/diarrhea, dysuria, or weakness/numbness/paresthesias in any extremity   Physical Exam  Triage Vital Signs: ED Triage Vitals  Encounter Vitals Group     BP 08/21/24 1953 (!) 184/75     Girls Systolic BP Percentile --      Girls Diastolic BP Percentile --      Boys Systolic BP Percentile --      Boys Diastolic BP Percentile --      Pulse Rate 08/21/24 1953 78     Resp 08/21/24 1953 18     Temp 08/21/24 1953 98 F (36.7 C)     Temp src --      SpO2 08/21/24 1953 98 %     Weight --      Height --      Head Circumference --      Peak Flow --      Pain Score 08/21/24 1952 7     Pain Loc --      Pain Education --      Exclude from Growth Chart --    Most recent vital signs: Vitals:   08/21/24 1953 08/21/24 2043  BP: (!) 184/75 (!) 159/77  Pulse: 78 71   Resp: 18 16  Temp: 98 F (36.7 C)   SpO2: 98% 100%   General: Awake, oriented x4. CV:  Good peripheral perfusion. Resp:  Normal effort. Abd:  No distention. Other:  Elderly overweight African-American female resting comfortably in no acute distress ED Results / Procedures / Treatments  Labs (all labs ordered are listed, but only abnormal results are displayed) Labs Reviewed  COMPREHENSIVE METABOLIC PANEL WITH GFR - Abnormal; Notable for the following components:      Result Value   Glucose, Bld 104 (*)    All other components within normal limits  CBC - Abnormal; Notable for the following components:   Hemoglobin 11.9 (*)    All other components within normal limits  URINALYSIS, ROUTINE W REFLEX MICROSCOPIC - Abnormal; Notable for the following components:   Color, Urine COLORLESS (*)    APPearance CLEAR (*)    Specific Gravity, Urine 1.001 (*)    Leukocytes,Ua MODERATE (*)    All other components within normal limits  LIPASE, BLOOD  PROCEDURES: Critical Care performed: No Procedures MEDICATIONS ORDERED IN ED: Medications - No data to display IMPRESSION / MDM / ASSESSMENT AND PLAN / ED COURSE  I reviewed the triage vital  signs and the nursing notes.                             The patient is on the cardiac monitor to evaluate for evidence of arrhythmia and/or significant heart rate changes. Patient's presentation is most consistent with acute presentation with potential threat to life or bodily function. Patient is a 68 year old female with the above-stated past medical history that presents for 2 hours of right lower lumbar back pain with radiation to the right lower quadrant of the abdomen, 1 episode of gross hematuria, and emesis.  Patient's pain and nausea have resolved at this time DDx: Kidney stone, pyelonephritis, appendicitis, diverticulitis, gastroenteritis, musculoskeletal pain Plan: CBC, CMP, UA, lipase Pertinent results: CMP within normal limits CBC showing  mild anemia with a hemoglobin of 11.9 UA only showing moderate leukocytes with no evidence of hematuria  Upon reassessment the patient is p.o. tolerant and feels comfortable with discharge at this time.  Patient agrees to follow-up with her primary care physician for any continued pain.  Patient given strict return precautions and all questions answered prior to discharge  Dispo: Discharge home with PCP follow-up   FINAL CLINICAL IMPRESSION(S) / ED DIAGNOSES   Final diagnoses:  Pain in right lumbar region of back  Right lower quadrant abdominal pain  Nausea and vomiting, unspecified vomiting type  Gross hematuria   Rx / DC Orders   ED Discharge Orders          Ordered    ondansetron  (ZOFRAN -ODT) 4 MG disintegrating tablet  Every 8 hours PRN        08/21/24 2120           Note:  This document was prepared using Dragon voice recognition software and may include unintentional dictation errors.   Jossie Artist POUR, MD 08/21/24 2120

## 2024-08-21 NOTE — ED Triage Notes (Signed)
 Pt had sudden onset of RLQ radiating into Right lower back. Reports hematuria and hematemesis

## 2024-08-21 NOTE — ED Notes (Signed)
 Patient transported to X-ray

## 2024-10-22 ENCOUNTER — Ambulatory Visit: Payer: Self-pay | Admitting: Family Medicine

## 2024-10-25 ENCOUNTER — Inpatient Hospital Stay: Admitting: Family Medicine

## 2025-02-04 ENCOUNTER — Ambulatory Visit: Admitting: Family Medicine
